# Patient Record
Sex: Male | Born: 1945 | Race: White | Hispanic: No | Marital: Married | Smoking: Never smoker
Health system: Southern US, Community
[De-identification: ages and names within clinical notes are randomized; demographics above are authoritative.]

## PROBLEM LIST (undated history)

## (undated) DIAGNOSIS — N138 Other obstructive and reflux uropathy: Secondary | ICD-10-CM

## (undated) DIAGNOSIS — R7303 Prediabetes: Secondary | ICD-10-CM

## (undated) DIAGNOSIS — I10 Essential (primary) hypertension: Secondary | ICD-10-CM

## (undated) DIAGNOSIS — N400 Enlarged prostate without lower urinary tract symptoms: Secondary | ICD-10-CM

## (undated) DIAGNOSIS — I4891 Unspecified atrial fibrillation: Secondary | ICD-10-CM

## (undated) DIAGNOSIS — G4730 Sleep apnea, unspecified: Secondary | ICD-10-CM

## (undated) DIAGNOSIS — K561 Intussusception: Secondary | ICD-10-CM

## (undated) DIAGNOSIS — R002 Palpitations: Secondary | ICD-10-CM

## (undated) DIAGNOSIS — T8859XA Other complications of anesthesia, initial encounter: Secondary | ICD-10-CM

## (undated) DIAGNOSIS — B259 Cytomegaloviral disease, unspecified: Secondary | ICD-10-CM

## (undated) DIAGNOSIS — T4145XA Adverse effect of unspecified anesthetic, initial encounter: Secondary | ICD-10-CM

## (undated) DIAGNOSIS — N401 Enlarged prostate with lower urinary tract symptoms: Secondary | ICD-10-CM

## (undated) DIAGNOSIS — D126 Benign neoplasm of colon, unspecified: Secondary | ICD-10-CM

## (undated) DIAGNOSIS — J189 Pneumonia, unspecified organism: Secondary | ICD-10-CM

## (undated) DIAGNOSIS — R7989 Other specified abnormal findings of blood chemistry: Secondary | ICD-10-CM

## (undated) DIAGNOSIS — M75122 Complete rotator cuff tear or rupture of left shoulder, not specified as traumatic: Secondary | ICD-10-CM

## (undated) DIAGNOSIS — M7582 Other shoulder lesions, left shoulder: Secondary | ICD-10-CM

## (undated) DIAGNOSIS — M1990 Unspecified osteoarthritis, unspecified site: Secondary | ICD-10-CM

## (undated) DIAGNOSIS — Z860100 Personal history of colon polyps, unspecified: Secondary | ICD-10-CM

## (undated) DIAGNOSIS — M222X1 Patellofemoral disorders, right knee: Secondary | ICD-10-CM

## (undated) DIAGNOSIS — M7522 Bicipital tendinitis, left shoulder: Secondary | ICD-10-CM

## (undated) DIAGNOSIS — M1711 Unilateral primary osteoarthritis, right knee: Secondary | ICD-10-CM

## (undated) DIAGNOSIS — Z7901 Long term (current) use of anticoagulants: Secondary | ICD-10-CM

## (undated) DIAGNOSIS — H53001 Unspecified amblyopia, right eye: Secondary | ICD-10-CM

## (undated) DIAGNOSIS — R42 Dizziness and giddiness: Secondary | ICD-10-CM

## (undated) HISTORY — PX: SMALL BOWEL REPAIR: SHX6447

## (undated) HISTORY — PX: WISDOM TOOTH EXTRACTION: SHX21

## (undated) HISTORY — PX: COLON SURGERY: SHX602

## (undated) HISTORY — PX: COLONOSCOPY: SHX174

---
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
## 2004-05-08 ENCOUNTER — Ambulatory Visit: Payer: Self-pay

## 2005-04-10 ENCOUNTER — Ambulatory Visit: Payer: Self-pay | Admitting: Unknown Physician Specialty

## 2006-06-29 ENCOUNTER — Ambulatory Visit: Payer: Self-pay | Admitting: Unknown Physician Specialty

## 2009-06-18 ENCOUNTER — Ambulatory Visit: Payer: Self-pay | Admitting: Unknown Physician Specialty

## 2010-08-28 ENCOUNTER — Ambulatory Visit: Payer: Self-pay | Admitting: Unknown Physician Specialty

## 2010-09-01 LAB — PATHOLOGY REPORT

## 2014-03-16 ENCOUNTER — Ambulatory Visit: Payer: Self-pay | Admitting: Otolaryngology

## 2015-09-04 ENCOUNTER — Encounter: Payer: Self-pay | Admitting: *Deleted

## 2015-09-05 ENCOUNTER — Encounter: Payer: Self-pay | Admitting: *Deleted

## 2015-09-05 ENCOUNTER — Ambulatory Visit: Payer: Medicare Other | Admitting: Certified Registered Nurse Anesthetist

## 2015-09-05 ENCOUNTER — Ambulatory Visit
Admission: RE | Admit: 2015-09-05 | Discharge: 2015-09-05 | Disposition: A | Discharge status: Home / Self Care | Payer: Medicare Other | Source: Ambulatory Visit | Attending: Unknown Physician Specialty | Admitting: Unknown Physician Specialty

## 2015-09-05 ENCOUNTER — Encounter
Admission: RE | Disposition: A | Discharge status: Home / Self Care | Payer: Self-pay | Source: Ambulatory Visit | Attending: Unknown Physician Specialty

## 2015-09-05 DIAGNOSIS — N400 Enlarged prostate without lower urinary tract symptoms: Secondary | ICD-10-CM | POA: Diagnosis not present

## 2015-09-05 DIAGNOSIS — D122 Benign neoplasm of ascending colon: Secondary | ICD-10-CM | POA: Insufficient documentation

## 2015-09-05 DIAGNOSIS — D123 Benign neoplasm of transverse colon: Secondary | ICD-10-CM | POA: Diagnosis not present

## 2015-09-05 DIAGNOSIS — D127 Benign neoplasm of rectosigmoid junction: Secondary | ICD-10-CM | POA: Insufficient documentation

## 2015-09-05 DIAGNOSIS — K5730 Diverticulosis of large intestine without perforation or abscess without bleeding: Secondary | ICD-10-CM | POA: Diagnosis not present

## 2015-09-05 DIAGNOSIS — Z79899 Other long term (current) drug therapy: Secondary | ICD-10-CM | POA: Insufficient documentation

## 2015-09-05 DIAGNOSIS — K640 First degree hemorrhoids: Secondary | ICD-10-CM | POA: Insufficient documentation

## 2015-09-05 DIAGNOSIS — Z1211 Encounter for screening for malignant neoplasm of colon: Secondary | ICD-10-CM | POA: Insufficient documentation

## 2015-09-05 HISTORY — DX: Intussusception: K56.1

## 2015-09-05 HISTORY — PX: COLONOSCOPY WITH PROPOFOL: SHX5780

## 2015-09-05 HISTORY — DX: Unspecified amblyopia, right eye: H53.001

## 2015-09-05 HISTORY — DX: Benign prostatic hyperplasia without lower urinary tract symptoms: N40.0

## 2015-09-05 SURGERY — COLONOSCOPY WITH PROPOFOL
Anesthesia: General

## 2015-09-05 MED ORDER — PROPOFOL 10 MG/ML IV BOLUS
INTRAVENOUS | Status: DC | PRN
  Administered 2015-09-05 (×3): 10 mg via INTRAVENOUS
  Administered 2015-09-05: 20 mg via INTRAVENOUS
  Administered 2015-09-05: 10 mg via INTRAVENOUS
  Administered 2015-09-05: 20 mg via INTRAVENOUS

## 2015-09-05 MED ORDER — SODIUM CHLORIDE 0.9 % IV SOLN: INTRAVENOUS | Status: DC

## 2015-09-05 MED ORDER — LIDOCAINE HCL (CARDIAC) 20 MG/ML IV SOLN
INTRAVENOUS | Status: DC | PRN
  Administered 2015-09-05: 60 mg via INTRAVENOUS

## 2015-09-05 MED ORDER — MIDAZOLAM HCL 2 MG/2ML IJ SOLN
INTRAMUSCULAR | Status: DC | PRN
  Administered 2015-09-05: 1 mg via INTRAVENOUS

## 2015-09-05 MED ORDER — PROPOFOL 500 MG/50ML IV EMUL
INTRAVENOUS | Status: DC | PRN
  Administered 2015-09-05: 140 ug/kg/min via INTRAVENOUS

## 2015-09-05 MED ORDER — SODIUM CHLORIDE 0.9 % IV SOLN
INTRAVENOUS | Status: DC
Start: 2015-09-05 — End: 2015-09-05
  Administered 2015-09-05: 07:00:00 via INTRAVENOUS

## 2015-09-05 NOTE — Transfer of Care (Signed)
Immediate Anesthesia Transfer of Care Note  Patient: Raymond Vazquez.  Procedure(s) Performed: Procedure(s): COLONOSCOPY WITH PROPOFOL (N/A)  Patient Location: PACU  Anesthesia Type:General  Level of Consciousness: sedated  Airway & Oxygen Therapy: Patient Spontanous Breathing and Patient connected to nasal cannula oxygen  Post-op Assessment: Report given to RN and Post -op Vital signs reviewed and stable  Post vital signs: Reviewed and stable  Last Vitals:  Filed Vitals:   09/05/15 0703 09/05/15 0813  BP: 148/81 93/61  Pulse: 79 64  Temp: 36.3 C 36.2 C  Resp: 16 28    Complications: No apparent anesthesia complications

## 2015-09-05 NOTE — H&P (Signed)
   Primary Care Physician:  No primary care provider on file. Primary Gastroenterologist:  Dr. Vira Agar  Pre-Procedure History & Physical: HPI:  Raymond Vazquez. is a 70 y.o. male is here for an colonoscopy.   Past Medical History  Diagnosis Date  . Hypertrophy (benign) of prostate   . 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 Amblyopia, right eye     Past Surgical History  Procedure Laterality Date  . Small bowel repair    . Wisdom tooth extraction      Prior to Admission medications   Medication Sig Start Date End Date Taking? Authorizing Provider  dutasteride (AVODART) 0.5 MG capsule Take 0.5 mg by mouth daily.   Yes Historical Provider, MD  Multiple Vitamin (MULTIVITAMIN) tablet Take 1 tablet by mouth daily.   Yes Historical Provider, MD  tamsulosin (FLOMAX) 0.4 MG CAPS capsule Take 0.4 mg by mouth daily.   Yes Historical Provider, MD    Allergies as of 08/21/2015  . (Not on File)    History reviewed. No pertinent family history.  Social History   Social History  . Marital Status: Married    Spouse Name: N/A  . Number of Children: N/A  . Years of Education: N/A   Occupational History  . Not on file.   Social History Main Topics  . Smoking status: Never Smoker   . Smokeless tobacco: Never Used  . Alcohol Use: No  . Drug Use: No  . Sexual Activity: Not on file   Other Topics Concern  . Not on file   Social History Narrative    Review of Systems: See HPI, otherwise negative ROS  Physical Exam: BP 148/81 mmHg  Pulse 79  Temp(Src) 97.4 F (36.3 C) (Tympanic)  Resp 16  Ht 5\' 9"  (1.753 m)  Wt 76.204 kg (168 lb)  BMI 24.80 kg/m2  SpO2 97% General:   Alert,  pleasant and cooperative in NAD Head:  Normocephalic and atraumatic. Neck:  Supple; no masses or thyromegaly. Lungs:  Clear throughout to auscultation.    Heart:  Regular rate and rhythm. Abdomen:  Soft, nontender and nondistended. Normal bowel sounds, without guarding, and without rebound.    Neurologic:  Alert and  oriented x4;  grossly normal neurologically.  Impression/Plan: Raymond Vazquez. is here for an colonoscopy to be performed for [facility] colon polyps  Risks, benefits, limitations, and alternatives regarding  colonoscopy have been reviewed with the patient.  Questions have been answered.  All parties agreeable.   Gaylyn Cheers, MD  09/05/2015, 7:22 AM

## 2015-09-05 NOTE — Anesthesia Procedure Notes (Signed)
Date/Time: 09/05/2015 7:28 AM Performed by: Johnna Acosta Pre-anesthesia Checklist: Patient identified, Emergency Drugs available, Suction available, Patient being monitored and Timeout performed Patient Re-evaluated:Patient Re-evaluated prior to inductionOxygen Delivery Method: Nasal cannula

## 2015-09-05 NOTE — Anesthesia Postprocedure Evaluation (Signed)
Anesthesia Post Note  Patient: Raymond Vazquez.  Procedure(s) Performed: Procedure(s) (LRB): COLONOSCOPY WITH PROPOFOL (N/A)  Patient location during evaluation: PACU Anesthesia Type: General Level of consciousness: awake and alert Pain management: pain level controlled Vital Signs Assessment: post-procedure vital signs reviewed and stable Respiratory status: spontaneous breathing, nonlabored ventilation, respiratory function stable and patient connected to nasal cannula oxygen Cardiovascular status: blood pressure returned to baseline and stable Postop Assessment: no signs of nausea or vomiting Anesthetic complications: no    Last Vitals:  Filed Vitals:   09/05/15 0843 09/05/15 0853  BP: 128/77 136/73  Pulse: 53 56  Temp:    Resp: 14 13    Last Pain:  Filed Vitals:   09/05/15 0855  PainSc: Asleep                 Molli Barrows

## 2015-09-05 NOTE — Anesthesia Preprocedure Evaluation (Signed)
Anesthesia Evaluation  Patient identified by MRN, date of birth, ID band Patient awake    Reviewed: Allergy & Precautions, H&P , NPO status , Patient's Chart, lab work & pertinent test results, reviewed documented beta blocker date and time   Airway Mallampati: II   Neck ROM: full    Dental  (+) Poor Dentition   Pulmonary neg pulmonary ROS,    Pulmonary exam normal        Cardiovascular negative cardio ROS Normal cardiovascular exam     Neuro/Psych negative neurological ROS  negative psych ROS   GI/Hepatic negative GI ROS, Neg liver ROS,   Endo/Other  negative endocrine ROS  Renal/GU negative Renal ROS  negative genitourinary   Musculoskeletal   Abdominal   Peds  Hematology negative hematology ROS (+)   Anesthesia Other Findings Past Medical History:   Hypertrophy (benign) of prostate                             Intussusception ([facility])                                        Amblyopia, right eye                                       Past Surgical History:   SMALL BOWEL REPAIR                                            WISDOM TOOTH EXTRACTION                                     BMI    Body Mass Index   24.79 kg/m 2     Reproductive/Obstetrics                             Anesthesia Physical Anesthesia Plan  ASA: II  Anesthesia Plan: General   Post-op Pain Management:    Induction:   Airway Management Planned:   Additional Equipment:   Intra-op Plan:   Post-operative Plan:   Informed Consent: I have reviewed the patients History and Physical, chart, labs and discussed the procedure including the risks, benefits and alternatives for the proposed anesthesia with the patient or authorized representative who has indicated his/her understanding and acceptance.   Dental Advisory Given  Plan Discussed with: CRNA  Anesthesia Plan Comments:         Anesthesia Quick  Evaluation

## 2015-09-05 NOTE — Op Note (Signed)
[facility] Gastroenterology Patient Name: Raymond Vazquez Procedure Date: 09/05/2015 6:34 AM MRN: [id number] Account #: [id number] Date of Birth: 10-05-1945 Admit Type: Outpatient Age: 70 Room: [facility] ENDO ROOM 1 Gender: Male Note Status: Finalized Procedure:            Colonoscopy Indications:          High risk colon cancer surveillance: Personal history                        of colonic polyps Providers:            Manya Silvas, MD Referring MD:         Ocie Cornfield. Ouida Sills, MD (Referring MD) Medicines:            Propofol per Anesthesia Complications:        No immediate complications. Procedure:            Pre-Anesthesia Assessment:                       - After reviewing the risks and benefits, the patient                        was deemed in satisfactory condition to undergo the                        procedure.                       After obtaining informed consent, the colonoscope was                        passed under direct vision. Throughout the procedure,                        the patient's blood pressure, pulse, and oxygen                        saturations were monitored continuously. The                        Colonoscope was introduced through the anus and                        advanced to the the cecum, identified by appendiceal                        orifice and ileocecal valve. The colonoscopy was                        performed without difficulty. The patient tolerated the                        procedure well. The quality of the bowel preparation                        was excellent. Findings:      Two sessile polyps were found in the ascending colon. The polyps were       diminutive in size. These polyps were removed with a jumbo cold forceps.       Resection and retrieval were complete.      Two sessile  polyps were found in the transverse colon. The polyps were       diminutive in size. These polyps were removed with a jumbo cold  forceps.       Resection and retrieval were complete.      A small polyp was found in the transverse colon. The polyp was sessile.       The polyp was removed with a hot snare. Resection and retrieval were       complete. To prevent bleeding after the polypectomy, one hemostatic clip       was successfully placed. There was no bleeding during, or at the end, of       the procedure.      A diminutive polyp was found in the recto-sigmoid colon. The polyp was       sessile. The polyp was removed with a jumbo cold forceps. Resection and       retrieval were complete.      A few small-mouthed diverticula were found in the transverse colon and       ascending colon.      Internal hemorrhoids were found during endoscopy. The hemorrhoids were       small and Grade I (internal hemorrhoids that do not prolapse).      The exam was otherwise without abnormality. Impression:           - Two diminutive polyps in the ascending colon, removed                        with a jumbo cold forceps. Resected and retrieved.                       - Two diminutive polyps in the transverse colon,                        removed with a jumbo cold forceps. Resected and                        retrieved.                       - One small polyp in the transverse colon, removed with                        a hot snare. Resected and retrieved. Clip was placed.                       - One diminutive polyp at the recto-sigmoid colon,                        removed with a jumbo cold forceps. Resected and                        retrieved.                       - Diverticulosis in the transverse colon and in the                        ascending colon.                       - Internal hemorrhoids.                       -  The examination was otherwise normal. Recommendation:       - Await pathology results. Manya Silvas, MD 09/05/2015 8:12:36 AM This report has been signed electronically. Number of Addenda: 0 Note  Initiated On: 09/05/2015 6:34 AM Scope Withdrawal Time: 0 hours 25 minutes 55 seconds  Total Procedure Duration: 0 hours 37 minutes 37 seconds       [facility]

## 2015-09-08 ENCOUNTER — Encounter: Payer: Self-pay | Admitting: Unknown Physician Specialty

## 2015-09-08 LAB — SURGICAL PATHOLOGY

## 2019-01-23 ENCOUNTER — Other Ambulatory Visit: Payer: Self-pay

## 2019-01-23 ENCOUNTER — Other Ambulatory Visit
Admission: RE | Admit: 2019-01-23 | Discharge: 2019-01-23 | Disposition: A | Discharge status: Home / Self Care | Payer: Medicare Other | Source: Ambulatory Visit | Attending: Gastroenterology | Admitting: Gastroenterology

## 2019-01-23 DIAGNOSIS — K5790 Diverticulosis of intestine, part unspecified, without perforation or abscess without bleeding: Secondary | ICD-10-CM | POA: Insufficient documentation

## 2019-01-23 DIAGNOSIS — K649 Unspecified hemorrhoids: Secondary | ICD-10-CM | POA: Diagnosis not present

## 2019-01-23 DIAGNOSIS — Z86010 Personal history of colonic polyps: Secondary | ICD-10-CM | POA: Diagnosis not present

## 2019-01-23 DIAGNOSIS — Z20828 Contact with and (suspected) exposure to other viral communicable diseases: Secondary | ICD-10-CM | POA: Diagnosis not present

## 2019-01-23 DIAGNOSIS — Z01812 Encounter for preprocedural laboratory examination: Secondary | ICD-10-CM | POA: Insufficient documentation

## 2019-01-24 LAB — SARS CORONAVIRUS 2 (TAT 6-24 HRS): SARS Coronavirus 2: NEGATIVE

## 2019-01-25 ENCOUNTER — Encounter: Payer: Self-pay | Admitting: *Deleted

## 2019-01-26 ENCOUNTER — Ambulatory Visit: Payer: Medicare Other | Admitting: Certified Registered Nurse Anesthetist

## 2019-01-26 ENCOUNTER — Encounter: Payer: Self-pay | Admitting: *Deleted

## 2019-01-26 ENCOUNTER — Encounter
Admission: RE | Disposition: A | Discharge status: Home / Self Care | Payer: Self-pay | Source: Home / Self Care | Attending: Gastroenterology

## 2019-01-26 ENCOUNTER — Ambulatory Visit
Admission: RE | Admit: 2019-01-26 | Discharge: 2019-01-26 | Disposition: A | Discharge status: Home / Self Care | Payer: Medicare Other | Attending: Gastroenterology | Admitting: Gastroenterology

## 2019-01-26 DIAGNOSIS — K5730 Diverticulosis of large intestine without perforation or abscess without bleeding: Secondary | ICD-10-CM | POA: Insufficient documentation

## 2019-01-26 DIAGNOSIS — Z79899 Other long term (current) drug therapy: Secondary | ICD-10-CM | POA: Diagnosis not present

## 2019-01-26 DIAGNOSIS — Z86010 Personal history of colonic polyps: Secondary | ICD-10-CM | POA: Insufficient documentation

## 2019-01-26 DIAGNOSIS — D123 Benign neoplasm of transverse colon: Secondary | ICD-10-CM | POA: Insufficient documentation

## 2019-01-26 DIAGNOSIS — N400 Enlarged prostate without lower urinary tract symptoms: Secondary | ICD-10-CM | POA: Insufficient documentation

## 2019-01-26 DIAGNOSIS — K644 Residual hemorrhoidal skin tags: Secondary | ICD-10-CM | POA: Diagnosis not present

## 2019-01-26 DIAGNOSIS — Z1211 Encounter for screening for malignant neoplasm of colon: Secondary | ICD-10-CM | POA: Diagnosis not present

## 2019-01-26 DIAGNOSIS — K641 Second degree hemorrhoids: Secondary | ICD-10-CM | POA: Diagnosis not present

## 2019-01-26 HISTORY — PX: COLONOSCOPY WITH PROPOFOL: SHX5780

## 2019-01-26 SURGERY — COLONOSCOPY WITH PROPOFOL
Anesthesia: General

## 2019-01-26 MED ORDER — GLYCOPYRROLATE 0.2 MG/ML IJ SOLN
INTRAMUSCULAR | Status: DC | PRN
  Administered 2019-01-26: 0.2 mg via INTRAVENOUS

## 2019-01-26 MED ORDER — GLYCOPYRROLATE 0.2 MG/ML IJ SOLN
INTRAMUSCULAR | Status: AC
  Filled 2019-01-26: qty 1

## 2019-01-26 MED ORDER — PROPOFOL 10 MG/ML IV BOLUS
INTRAVENOUS | Status: AC
  Filled 2019-01-26: qty 20

## 2019-01-26 MED ORDER — SODIUM CHLORIDE 0.9 % IV SOLN: INTRAVENOUS | Status: DC

## 2019-01-26 MED ORDER — LIDOCAINE HCL (PF) 2 % IJ SOLN
INTRAMUSCULAR | Status: AC
  Filled 2019-01-26: qty 10

## 2019-01-26 MED ORDER — SODIUM CHLORIDE 0.9 % IV SOLN
INTRAVENOUS | Status: DC
  Administered 2019-01-26: 1000 mL via INTRAVENOUS

## 2019-01-26 MED ORDER — EPHEDRINE SULFATE 50 MG/ML IJ SOLN
INTRAMUSCULAR | Status: AC
  Filled 2019-01-26: qty 1

## 2019-01-26 MED ORDER — PROPOFOL 500 MG/50ML IV EMUL
INTRAVENOUS | Status: DC | PRN
  Administered 2019-01-26: 150 ug/kg/min via INTRAVENOUS

## 2019-01-26 MED ORDER — LIDOCAINE HCL (CARDIAC) PF 100 MG/5ML IV SOSY
PREFILLED_SYRINGE | INTRAVENOUS | Status: DC | PRN
  Administered 2019-01-26: 80 mg via INTRATRACHEAL

## 2019-01-26 MED ORDER — PROPOFOL 10 MG/ML IV BOLUS
INTRAVENOUS | Status: DC | PRN
  Administered 2019-01-26: 60 mg via INTRAVENOUS

## 2019-01-26 MED ORDER — EPHEDRINE SULFATE 50 MG/ML IJ SOLN
INTRAMUSCULAR | Status: DC | PRN
  Administered 2019-01-26: 10 mg via INTRAVENOUS

## 2019-01-26 NOTE — Transfer of Care (Signed)
Immediate Anesthesia Transfer of Care Note  Patient: Raymond Vazquez.  Procedure(s) Performed: COLONOSCOPY WITH PROPOFOL (N/A )  Patient Location: PACU  Anesthesia Type:General  Level of Consciousness: drowsy  Airway & Oxygen Therapy: Patient Spontanous Breathing and Patient connected to nasal cannula oxygen  Post-op Assessment: Report given to RN, Post -op Vital signs reviewed and stable and Patient moving all extremities X 4  Post vital signs: Reviewed and stable  Last Vitals:  Vitals Value Taken Time  BP 122/78 01/26/19 1000  Temp 36.4 C 01/26/19 1000  Pulse 80 01/26/19 1000  Resp 14 01/26/19 1000  SpO2 100 % 01/26/19 1000    Last Pain:  Vitals:   01/26/19 1000  TempSrc: Tympanic  PainSc:          Complications: No apparent anesthesia complications

## 2019-01-26 NOTE — Anesthesia Preprocedure Evaluation (Signed)
Anesthesia Evaluation  Patient identified by MRN, date of birth, ID band Patient awake    Reviewed: Allergy & Precautions, H&P , NPO status , Patient's Chart, lab work & pertinent test results  Airway Mallampati: I  TM Distance: >3 FB Neck ROM: full    Dental  (+) Teeth Intact   Pulmonary neg pulmonary ROS, neg COPD,           Cardiovascular (-) angina(-) Past MI and (-) Cardiac Stents negative cardio ROS  (-) dysrhythmias      Neuro/Psych negative neurological ROS  negative psych ROS   GI/Hepatic Neg liver ROS, H/o intussusception and small bowel repair   Endo/Other  negative endocrine ROS  Renal/GU negative Renal ROS  negative genitourinary   Musculoskeletal   Abdominal   Peds  Hematology negative hematology ROS (+)   Anesthesia Other Findings Past Medical History: No date: Amblyopia, right eye No date: Hypertrophy (benign) of prostate No date: Intussusception [facility])  Past Surgical History: No date: COLON SURGERY 09/05/2015: COLONOSCOPY WITH PROPOFOL; N/A     Comment:  Procedure: COLONOSCOPY WITH PROPOFOL;  Surgeon: Manya Silvas, MD;  Location[facility] ENDOSCOPY;  Service:               Endoscopy;  Laterality: N/A; No date: SMALL BOWEL REPAIR No date: WISDOM TOOTH EXTRACTION  BMI    Body Mass Index: 23.63 kg/m      Reproductive/Obstetrics negative OB ROS                             Anesthesia Physical Anesthesia Plan  ASA: I  Anesthesia Plan: General   Post-op Pain Management:    Induction:   PONV Risk Score and Plan: Propofol infusion and TIVA  Airway Management Planned: Natural Airway and Nasal Cannula  Additional Equipment:   Intra-op Plan:   Post-operative Plan:   Informed Consent: I have reviewed the patients History and Physical, chart, labs and discussed the procedure including the risks, benefits and alternatives for the proposed  anesthesia with the patient or authorized representative who has indicated his/her understanding and acceptance.     Dental Advisory Given  Plan Discussed with: Anesthesiologist and CRNA  Anesthesia Plan Comments:         Anesthesia Quick Evaluation

## 2019-01-26 NOTE — Op Note (Signed)
[facility] Gastroenterology Patient Name: Raymond Vazquez Procedure Date: 01/26/2019 8:31 AM MRN: [id number] Account #: [id number] Date of Birth: 1945/08/18 Admit Type: Outpatient Age: 73 Room: [facility] ENDO ROOM 2 Gender: Male Note Status: Finalized Procedure:            Colonoscopy Indications:          Personal history of colonic polyps Providers:            Lollie Sails, MD Referring MD:         Ocie Cornfield. Ouida Sills MD, MD (Referring MD) Medicines:            Monitored Anesthesia Care Complications:        No immediate complications. Procedure:            Pre-Anesthesia Assessment:                       - ASA Grade Assessment: I - A normal, healthy patient.                       After obtaining informed consent, the colonoscope was                        passed under direct vision. Throughout the procedure,                        the patient's blood pressure, pulse, and oxygen                        saturations were monitored continuously. The                        Colonoscope was introduced through the anus and                        advanced to the the cecum, identified by appendiceal                        orifice and ileocecal valve. The colonoscopy was                        unusually difficult due to a redundant colon and                        significant looping. Successful completion of the                        procedure was aided by changing the patient to a supine                        position, changing the patient to a prone position,                        using manual pressure and withdrawing and reinserting                        the scope. The patient tolerated the procedure well.                        The quality of the bowel preparation was good. Findings:  Multiple medium-mouthed diverticula were found in the sigmoid colon,       descending colon, transverse colon and ascending colon.      A 2 mm polyp was found in the  transverse colon. The polyp was sessile.       The polyp was removed with a cold biopsy forceps. Resection and       retrieval were complete.      A 3 mm polyp was found in the transverse colon. The polyp was sessile.       The polyp was removed with a cold biopsy forceps. Resection and       retrieval were complete.      Non-bleeding external and internal hemorrhoids were found during       retroflexion, during digital exam and during anoscopy. The hemorrhoids       were small and Grade II (internal hemorrhoids that prolapse but reduce       spontaneously).      No additional abnormalities were found on retroflexion.      Possible tattoo site located in the proximal transverse region, no       evidence of residual polyp. Impression:           - Diverticulosis in the sigmoid colon, in the                        descending colon, in the transverse colon and in the                        ascending colon.                       - One 2 mm polyp in the transverse colon, removed with                        a cold biopsy forceps. Resected and retrieved.                       - One 3 mm polyp in the transverse colon, removed with                        a cold biopsy forceps. Resected and retrieved.                       - Non-bleeding external and internal hemorrhoids. Recommendation:       - Discharge patient to home.                       - Await pathology results.                       - Telephone GI clinic for pathology results in 5 days. Procedure Code(s):    --- Professional ---                       [phone], Colonoscopy, flexible; with biopsy, single or                        multiple Diagnosis Code(s):    --- Professional ---                       K64.1, Second degree hemorrhoids  K63.5, Polyp of colon                       Z86.010, Personal history of colonic polyps                       K57.30, Diverticulosis of large intestine without                         perforation or abscess without bleeding CPT copyright 2019 [employer]. All rights reserved. The codes documented in this report are preliminary and upon coder review may  be revised to meet current compliance requirements. Lollie Sails, MD 01/26/2019 10:00:53 AM This report has been signed electronically. Number of Addenda: 0 Note Initiated On: 01/26/2019 8:31 AM Scope Withdrawal Time: 0 hours 13 minutes 55 seconds  Total Procedure Duration: 0 hours 51 minutes 7 seconds       [facility]

## 2019-01-26 NOTE — Anesthesia Postprocedure Evaluation (Signed)
Anesthesia Post Note  Patient: Raymond Vazquez.  Procedure(s) Performed: COLONOSCOPY WITH PROPOFOL (N/A )  Patient location during evaluation: PACU Anesthesia Type: General Level of consciousness: awake and alert Pain management: pain level controlled Vital Signs Assessment: post-procedure vital signs reviewed and stable Respiratory status: spontaneous breathing, nonlabored ventilation and respiratory function stable Cardiovascular status: blood pressure returned to baseline and stable Postop Assessment: no apparent nausea or vomiting Anesthetic complications: no     Last Vitals:  Vitals:   01/26/19 1010 01/26/19 1020  BP: 126/75 127/78  Pulse: 69 65  Resp: 15 15  Temp:    SpO2: 98% 100%    Last Pain:  Vitals:   01/26/19 1000  TempSrc: Tympanic  PainSc:                  Durenda Hurt

## 2019-01-26 NOTE — H&P (Signed)
Outpatient short stay form Pre-procedure 01/26/2019 8:46 AM Lollie Sails MD  Primary Physician: Dr. Frazier Richards  Reason for visit: Colonoscopy  History of present illness: Patient is a 73 year old male presenting today for colonoscopy in regards to his personal history of adenomatous colon polyps.  His last colonoscopy was 09/05/2015 with a finding of 6 adenomas removed at that time.  Tolerated his prep well.  He takes no aspirin or blood thinning agent.    Current Facility-Administered Medications:  .  0.9 %  sodium chloride infusion, , Intravenous, Continuous, Lollie Sails, MD .  0.9 %  sodium chloride infusion, , Intravenous, Continuous, Lollie Sails, MD, Last Rate: 20 mL/hr at 01/26/19 0842, 1,000 mL at 01/26/19 [id number]  Medications Prior to Admission  Medication Sig Dispense Refill Last Dose  . dutasteride (AVODART) 0.5 MG capsule Take 0.5 mg by mouth daily.   Past Week at Unknown time  . Multiple Vitamin (MULTIVITAMIN) tablet Take 1 tablet by mouth daily.   Past Week at Unknown time  . tamsulosin (FLOMAX) 0.4 MG CAPS capsule Take 0.4 mg by mouth daily.   Past Week at Unknown time     No Known Allergies   Past Medical History:  Diagnosis Date  . Amblyopia, right eye   . Hypertrophy (benign) of prostate   . 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    HEENT: Normocephalic atraumatic eyes are anicteric    Other:    Pertinant exam for procedure: Soft nontender nondistended bowel sounds positive normoactive    Planned proceedures: Colonoscopy and indicated procedures. I have discussed the risks benefits and complications of procedures to include not limited to bleeding, infection, perforation and the risk of sedation and the patient wishes to proceed.    Lollie Sails, MD Gastroenterology 01/26/2019  8:46 AM

## 2019-01-26 NOTE — Anesthesia Post-op Follow-up Note (Signed)
Anesthesia QCDR form completed.        

## 2019-01-29 ENCOUNTER — Encounter: Payer: Self-pay | Admitting: Gastroenterology

## 2019-01-29 LAB — SURGICAL PATHOLOGY

## 2019-03-29 ENCOUNTER — Emergency Department: Payer: Medicare Other

## 2019-03-29 ENCOUNTER — Observation Stay
Admission: EM | Admit: 2019-03-29 | Discharge: 2019-03-30 | Disposition: A | Discharge status: Home / Self Care | Payer: Medicare Other | Attending: Internal Medicine | Admitting: Internal Medicine

## 2019-03-29 ENCOUNTER — Encounter: Payer: Self-pay | Admitting: Emergency Medicine

## 2019-03-29 ENCOUNTER — Other Ambulatory Visit: Payer: Self-pay

## 2019-03-29 DIAGNOSIS — Z79899 Other long term (current) drug therapy: Secondary | ICD-10-CM | POA: Insufficient documentation

## 2019-03-29 DIAGNOSIS — Z7901 Long term (current) use of anticoagulants: Secondary | ICD-10-CM | POA: Diagnosis not present

## 2019-03-29 DIAGNOSIS — R739 Hyperglycemia, unspecified: Secondary | ICD-10-CM | POA: Insufficient documentation

## 2019-03-29 DIAGNOSIS — N400 Enlarged prostate without lower urinary tract symptoms: Secondary | ICD-10-CM | POA: Insufficient documentation

## 2019-03-29 DIAGNOSIS — I700 Atherosclerosis of aorta: Secondary | ICD-10-CM | POA: Insufficient documentation

## 2019-03-29 DIAGNOSIS — I4891 Unspecified atrial fibrillation: Secondary | ICD-10-CM | POA: Diagnosis not present

## 2019-03-29 DIAGNOSIS — Z20828 Contact with and (suspected) exposure to other viral communicable diseases: Secondary | ICD-10-CM | POA: Insufficient documentation

## 2019-03-29 LAB — CBC
HCT: 49 % (ref 39.0–52.0)
Hemoglobin: 16 g/dL (ref 13.0–17.0)
MCH: 28.4 pg (ref 26.0–34.0)
MCHC: 32.7 g/dL (ref 30.0–36.0)
MCV: 86.9 fL (ref 80.0–100.0)
Platelets: 215 10*3/uL (ref 150–400)
RBC: 5.64 MIL/uL (ref 4.22–5.81)
RDW: 12.3 % (ref 11.5–15.5)
WBC: 6.3 10*3/uL (ref 4.0–10.5)
nRBC: 0 % (ref 0.0–0.2)

## 2019-03-29 LAB — BASIC METABOLIC PANEL
Anion gap: 12 (ref 5–15)
BUN: 28 mg/dL — ABNORMAL HIGH (ref 8–23)
CO2: 24 mmol/L (ref 22–32)
Calcium: 9.3 mg/dL (ref 8.9–10.3)
Chloride: 105 mmol/L (ref 98–111)
Creatinine, Ser: 0.93 mg/dL (ref 0.61–1.24)
GFR calc Af Amer: >60 mL/min (ref >60)
GFR calc non Af Amer: >60 mL/min (ref >60)
Glucose, Bld: 181 mg/dL — ABNORMAL HIGH (ref 70–99)
Potassium: 3.9 mmol/L (ref 3.5–5.1)
Sodium: 141 mmol/L (ref 135–145)

## 2019-03-29 LAB — HEMOGLOBIN A1C
Hgb A1c MFr Bld: 5.6 % (ref 4.8–5.6)
Mean Plasma Glucose: 114.02 mg/dL

## 2019-03-29 LAB — TSH: TSH: 1.905 u[IU]/mL (ref 0.350–4.500)

## 2019-03-29 LAB — PROTIME-INR
INR: 0.9 (ref 0.8–1.2)
Prothrombin Time: 12.3 seconds (ref 11.4–15.2)

## 2019-03-29 LAB — SARS CORONAVIRUS 2 (TAT 6-24 HRS): SARS Coronavirus 2: NEGATIVE

## 2019-03-29 LAB — APTT: aPTT: 28 seconds (ref 24–36)

## 2019-03-29 LAB — TROPONIN I (HIGH SENSITIVITY): Troponin I (High Sensitivity): 4 ng/L (ref <18)

## 2019-03-29 LAB — T4, FREE: Free T4: 0.91 ng/dL (ref 0.61–1.12)

## 2019-03-29 MED ORDER — DILTIAZEM HCL 25 MG/5ML IV SOLN
20.0000 mg | Freq: Once | INTRAVENOUS | Status: AC
  Administered 2019-03-29: 11:00:00 20 mg via INTRAVENOUS
  Filled 2019-03-29: qty 5

## 2019-03-29 MED ORDER — DILTIAZEM HCL 100 MG IV SOLR
5.0000 mg/h | INTRAVENOUS | Status: DC
  Administered 2019-03-29: 5 mg/h via INTRAVENOUS
  Filled 2019-03-29: qty 100

## 2019-03-29 MED ORDER — TAMSULOSIN HCL 0.4 MG PO CAPS
0.4000 mg | ORAL_CAPSULE | Freq: Every day | ORAL | Status: DC
  Administered 2019-03-29: 0.4 mg via ORAL
  Filled 2019-03-29 (×2): qty 1

## 2019-03-29 MED ORDER — SODIUM CHLORIDE 0.9% FLUSH
3.0000 mL | Freq: Two times a day (BID) | INTRAVENOUS | Status: DC
  Administered 2019-03-29: 23:00:00 3 mL via INTRAVENOUS

## 2019-03-29 MED ORDER — APIXABAN 5 MG PO TABS
5.0000 mg | ORAL_TABLET | Freq: Two times a day (BID) | ORAL | Status: DC
  Administered 2019-03-29 – 2019-03-30 (×3): 5 mg via ORAL
  Filled 2019-03-29 (×3): qty 1

## 2019-03-29 MED ORDER — SODIUM CHLORIDE 0.9 % IV SOLN
Freq: Once | INTRAVENOUS | Status: AC
  Administered 2019-03-29: 11:00:00 via INTRAVENOUS

## 2019-03-29 MED ORDER — ACETAMINOPHEN 325 MG PO TABS: 650.0000 mg | ORAL_TABLET | Freq: Four times a day (QID) | ORAL | Status: DC | PRN

## 2019-03-29 MED ORDER — ACETAMINOPHEN 650 MG RE SUPP: 650.0000 mg | Freq: Four times a day (QID) | RECTAL | Status: DC | PRN

## 2019-03-29 MED ORDER — ONDANSETRON HCL 4 MG PO TABS: 4.0000 mg | ORAL_TABLET | Freq: Four times a day (QID) | ORAL | Status: DC | PRN

## 2019-03-29 MED ORDER — ONDANSETRON HCL 4 MG/2ML IJ SOLN: 4.0000 mg | Freq: Four times a day (QID) | INTRAMUSCULAR | Status: DC | PRN

## 2019-03-29 MED ORDER — DUTASTERIDE 0.5 MG PO CAPS
0.5000 mg | ORAL_CAPSULE | Freq: Every day | ORAL | Status: DC
  Administered 2019-03-29: 0.5 mg via ORAL
  Filled 2019-03-29 (×2): qty 1

## 2019-03-29 MED ORDER — DILTIAZEM HCL 25 MG/5ML IV SOLN
10.0000 mg | Freq: Once | INTRAVENOUS | Status: AC
  Administered 2019-03-29: 10 mg via INTRAVENOUS
  Filled 2019-03-29: qty 5

## 2019-03-29 MED ORDER — DILTIAZEM HCL ER COATED BEADS 120 MG PO CP24
240.0000 mg | ORAL_CAPSULE | Freq: Every day | ORAL | Status: DC
  Administered 2019-03-29 – 2019-03-30 (×2): 240 mg via ORAL
  Filled 2019-03-29: qty 1
  Filled 2019-03-29: qty 2
  Filled 2019-03-29: qty 1

## 2019-03-29 MED ORDER — POLYETHYLENE GLYCOL 3350 17 G PO PACK: 17.0000 g | PACK | Freq: Every day | ORAL | Status: DC | PRN

## 2019-03-29 NOTE — ED Notes (Signed)
Pt placed in hospital bed. Ate all of dinner tray. Given remote. No other needs.

## 2019-03-29 NOTE — ED Notes (Signed)
Pt given orange juice.

## 2019-03-29 NOTE — ED Provider Notes (Signed)
[facility] Emergency Department Provider Note       Time seen: ----------------------------------------- 10:16 AM on 03/29/2019 -----------------------------------------   I have reviewed the triage vital signs and the nursing notes.  HISTORY   Chief Complaint No chief complaint on file.    HPI Raymond Vazquez. is a 73 y.o. male with a history of BPH, intussusception who presents to the ED for irregular heartbeat.  Patient states his heart was fluttering this morning, he has never really had that before.  He reports a history of bigeminy once but no known history of arrhythmias.  He denies any recent illness or other complaints.  Past Medical History:  Diagnosis Date  . Amblyopia, right eye   . Hypertrophy (benign) of prostate   . Intussusception ([place])     There are no active problems to display for this patient.   Past Surgical History:  Procedure Laterality Date  . COLON SURGERY    . COLONOSCOPY WITH PROPOFOL N/A 09/05/2015   Procedure: COLONOSCOPY WITH PROPOFOL;  Surgeon: Manya Silvas, MD;  Location: [facility] ENDOSCOPY;  Service: Endoscopy;  Laterality: N/A;  . COLONOSCOPY WITH PROPOFOL N/A 01/26/2019   Procedure: COLONOSCOPY WITH PROPOFOL;  Surgeon: Lollie Sails, MD;  Location[facility] ENDOSCOPY;  Service: Endoscopy;  Laterality: N/A;  . SMALL BOWEL REPAIR    . WISDOM TOOTH EXTRACTION      Allergies Patient has no known allergies.  Social History Social History   Tobacco Use  . Smoking status: Never Smoker  . Smokeless tobacco: Never Used  Substance Use Topics  . Alcohol use: No  . Drug use: Never    Review of Systems Constitutional: Negative for fever. Cardiovascular: Negative for chest pain.  Positive for palpitations Respiratory: Negative for shortness of breath. Gastrointestinal: Negative for abdominal pain, vomiting and diarrhea. Musculoskeletal: Negative for back pain. Skin: Negative for rash. Neurological:  Negative for headaches, focal weakness or numbness.  All systems negative/normal/unremarkable except as stated in the HPI  ____________________________________________   PHYSICAL EXAM:  VITAL SIGNS: ED Triage Vitals  Enc Vitals Group     BP      Pulse      Resp      Temp      Temp src      SpO2      Weight      Height      Head Circumference      Peak Flow      Pain Score      Pain Loc      Pain Edu?      Excl. in [place]?     Constitutional: Alert and oriented. Well appearing and in no distress. Eyes: Conjunctivae are normal. Normal extraocular movements. ENT      Head: Normocephalic and atraumatic.      Nose: No congestion/rhinnorhea.      Mouth/Throat: Mucous membranes are moist.      Neck: No stridor. Cardiovascular: Rapid rate, irregular rhythm. No murmurs, rubs, or gallops. Respiratory: Normal respiratory effort without tachypnea nor retractions. Breath sounds are clear and equal bilaterally. No wheezes/rales/rhonchi. Gastrointestinal: Soft and nontender. Normal bowel sounds Musculoskeletal: Nontender with normal range of motion in extremities. No lower extremity tenderness nor edema. Neurologic:  Normal speech and language. No gross focal neurologic deficits are appreciated.  Skin:  Skin is warm, dry and intact. No rash noted. Psychiatric: Mood and affect are normal. Speech and behavior are normal.  ____________________________________________  EKG: Interpreted by me.  Atrial  fibrillation with a rapid ventricular response, rate is 161 bpm, PVCs, normal axis, normal QT  ____________________________________________  ED COURSE:  As part of my medical decision making, I reviewed the following data within the [place] History obtained from family if available, nursing notes, old chart and ekg, as well as notes from prior ED visits. Patient presented for palpitations, we will assess with labs and imaging as indicated at this time.    Procedures  Raymond Vazquez. was evaluated in Emergency Department on 03/29/2019 for the symptoms described in the history of present illness. He was evaluated in the context of the global COVID-19 pandemic, which necessitated consideration that the patient might be at risk for infection with the SARS-CoV-2 virus that causes COVID-19. Institutional protocols and algorithms that pertain to the evaluation of patients at risk for COVID-19 are in a state of rapid change based on information released by regulatory bodies including the CDC and federal and state organizations. These policies and algorithms were followed during the patient's care in the ED.  ____________________________________________   LABS (pertinent positives/negatives)  Labs Reviewed  BASIC METABOLIC PANEL - Abnormal; Notable for the following components:      Result Value   Glucose, Bld 181 (*)    BUN 28 (*)    All other components within normal limits  SARS CORONAVIRUS 2 (TAT 6-24 HRS)  CBC  TSH  T4, FREE  APTT  PROTIME-INR  TROPONIN I (HIGH SENSITIVITY)   CRITICAL CARE Performed by: Laurence Aly   Total critical care time: 30 minutes  Critical care time was exclusive of separately billable procedures and treating other patients.  Critical care was necessary to treat or prevent imminent or life-threatening deterioration.  Critical care was time spent personally by me on the following activities: development of treatment plan with patient and/or surrogate as well as nursing, discussions with consultants, evaluation of patient's response to treatment, examination of patient, obtaining history from patient or surrogate, ordering and performing treatments and interventions, ordering and review of laboratory studies, ordering and review of radiographic studies, pulse oximetry and re-evaluation of patient's condition.  RADIOLOGY Images were viewed by me  Chest x-ray IMPRESSION:  No edema or  consolidation. Cardiac silhouette within normal limits.  Aortic Atherosclerosis (ICD10-I70.0).  ____________________________________________   DIFFERENTIAL DIAGNOSIS   Atrial fibrillation, arrhythmia, MI, PE, valvular disease, anemia, occult infection  FINAL ASSESSMENT AND PLAN  Atrial fibrillation with rapid ventricular response   Plan: The patient had presented for A. fib with RVR.  Patient was started on 10 mg IV Cardizem and given IV fluids.  Patient's labs did not reveal any acute process. Patient's imaging was reassuring.  He received 2 doses of Cardizem with some improvement in his heart rate but still is in the 120s.  He will be placed on a Cardizem drip.  I will discuss with cardiology and admit to the hospitalist service.   Laurence Aly, MD    Note: This note was generated in part or whole with voice recognition software. Voice recognition is usually quite accurate but there are transcription errors that can and very often do occur. I apologize for any typographical errors that were not detected and corrected.     Earleen Newport, MD 03/29/19 [phone]

## 2019-03-29 NOTE — ED Triage Notes (Signed)
Pt arrived from home c/o irregular heartbeat. Pt stated heart was fluttering.

## 2019-03-29 NOTE — ED Notes (Signed)
Discussed pt with dr sudini and if ok to decrease gtt as tolerated by VS.  HR 65-90 on average occasionally hitting upper 90s but is dipping into 60s.  Remains in A-fib.  Will decrease gtt to 5mg /hr per dr sudini.

## 2019-03-29 NOTE — H&P (Signed)
Raymond Vazquez NAME: Raymond Vazquez    MR#:  [id number]  DATE OF BIRTH:  April 01, 1946  DATE OF ADMISSION:  03/29/2019  PRIMARY CARE PHYSICIAN: Kirk Ruths, MD   REQUESTING/REFERRING PHYSICIAN: Dr. Jimmye Norman  CHIEF COMPLAINT:   Chief Complaint  Patient presents with  . Irregular Heart Beat    HISTORY OF PRESENT ILLNESS:  Raymond Vazquez  is a 73 y.o. male with a known history of BPH and Bigeminy presented to ER today due to fluttering in the chest and SOB, nausea.No CP. Felt dizzy. He has had breif episodes of chest flutter lasting only a few seconds for many months. After arrival in ER he was found to have Afib with RVR. 2 doses of IV cardizem were given and HR continued to be in 130s. Started on cardizem drip. He feels better at this time. No change in meds recently. Rarely drinks alcohol. Doesn't smoke  PAST MEDICAL HISTORY:   Past Medical History:  Diagnosis Date  . Amblyopia, right eye   . Hypertrophy (benign) of prostate   . Intussusception ([place])     PAST SURGICAL HISTORY:   Past Surgical History:  Procedure Laterality Date  . COLON SURGERY    . COLONOSCOPY WITH PROPOFOL N/A 09/05/2015   Procedure: COLONOSCOPY WITH PROPOFOL;  Surgeon: Manya Silvas, MD;  Location[facility] ENDOSCOPY;  Service: Endoscopy;  Laterality: N/A;  . COLONOSCOPY WITH PROPOFOL N/A 01/26/2019   Procedure: COLONOSCOPY WITH PROPOFOL;  Surgeon: Lollie Sails, MD;  Location: [facility] ENDOSCOPY;  Service: Endoscopy;  Laterality: N/A;  . SMALL BOWEL REPAIR    . WISDOM TOOTH EXTRACTION      SOCIAL HISTORY:   Social History   Tobacco Use  . Smoking status: Never Smoker  . Smokeless tobacco: Never Used  Substance Use Topics  . Alcohol use: No    FAMILY HISTORY:  History reviewed. No pertinent family history. Prostate cancer, CAD DRUG ALLERGIES:  No Known Allergies  REVIEW OF SYSTEMS:   Review of Systems  Constitutional: Positive for  malaise/fatigue. Negative for chills, fever and weight loss.  HENT: Negative for hearing loss and nosebleeds.   Eyes: Negative for blurred vision, double vision and pain.  Respiratory: Positive for shortness of breath. Negative for cough, hemoptysis, sputum production and wheezing.   Cardiovascular: Positive for palpitations. Negative for chest pain, orthopnea and leg swelling.  Gastrointestinal: Positive for nausea. Negative for abdominal pain, constipation, diarrhea and vomiting.  Genitourinary: Negative for dysuria and hematuria.  Musculoskeletal: Negative for back pain, falls and myalgias.  Skin: Negative for rash.  Neurological: Positive for dizziness. Negative for tremors, sensory change, speech change, focal weakness, seizures and headaches.  Endo/Heme/Allergies: Does not bruise/bleed easily.  Psychiatric/Behavioral: Negative for depression and memory loss. The patient is not nervous/anxious.     MEDICATIONS AT HOME:   Prior to Admission medications   Medication Sig Start Date End Date Taking? Authorizing Provider  dutasteride (AVODART) 0.5 MG capsule Take 0.5 mg by mouth daily.   Yes [provider]  Multiple Vitamin (MULTIVITAMIN) tablet Take 1 tablet by mouth daily.   Yes [provider]  tamsulosin (FLOMAX) 0.4 MG CAPS capsule Take 0.4 mg by mouth daily.   Yes [provider]     VITAL SIGNS:  Blood pressure 126/75, pulse (!) 123, temperature 97.9 F (36.6 C), temperature source Oral, resp. rate 16, height 5\' 9"  (1.753 m), weight 72.6 kg, SpO2 96 %.  PHYSICAL EXAMINATION:  Physical  Exam  GENERAL:  73 y.o.-year-old patient lying in the bed with no acute distress.  EYES: Pupils equal, round, reactive to light and accommodation. No scleral icterus. Extraocular muscles intact.  HEENT: Head atraumatic, normocephalic. Oropharynx and nasopharynx clear. No oropharyngeal erythema, moist oral mucosa  NECK:  Supple, no jugular venous distention. No thyroid  enlargement, no tenderness.  LUNGS: Normal breath sounds bilaterally, no wheezing, rales, rhonchi. No use of accessory muscles of respiration.  CARDIOVASCULAR: Irregularly irregular. Tachycardic ABDOMEN: Soft, nontender, nondistended. Bowel sounds present. No organomegaly or mass.  EXTREMITIES: No pedal edema, cyanosis, or clubbing. + 2 pedal & radial pulses b/l.   NEUROLOGIC: Cranial nerves II through XII are intact. No focal Motor or sensory deficits appreciated b/l PSYCHIATRIC: The patient is alert and oriented x 3. Good affect.  SKIN: No obvious rash, lesion, or ulcer.   LABORATORY PANEL:   CBC Recent Labs  Lab 03/29/19 1042  WBC 6.3  HGB 16.0  HCT 49.0  PLT 215   ------------------------------------------------------------------------------------------------------------------  Chemistries  Recent Labs  Lab 03/29/19 1042  NA 141  K 3.9  CL 105  CO2 24  GLUCOSE 181*  BUN 28*  CREATININE 0.93  CALCIUM 9.3   ------------------------------------------------------------------------------------------------------------------  Cardiac Enzymes No results for input(s): TROPONINI in the last 168 hours. ------------------------------------------------------------------------------------------------------------------  RADIOLOGY:  Dg Chest Port 1 View  Result Date: 03/29/2019 CLINICAL DATA:  Chest pain and shortness of breath. Cardiac arrhythmia. EXAM: PORTABLE CHEST 1 VIEW COMPARISON:  None. FINDINGS: There is no edema or consolidation. Heart size and pulmonary vascularity are normal. No adenopathy. No pneumothorax. There is degenerative change in each shoulder. There is aortic atherosclerosis. IMPRESSION: No edema or consolidation. Cardiac silhouette within normal limits. Aortic Atherosclerosis (ICD10-I70.0). Electronically Signed   By: Lowella Grip III M.D.   On: 03/29/2019 10:51   IMPRESSION AND PLAN:   * Afib with RVR Likely has had Afib for some time due to  fluttering episodes in the past.  Start cardizem drip and admit inpatient to Telemetry unit. Eliquis started by cardiology. Check TSH, Echo  * BPH Continue Home meds  * Hyperglycemia No h/o DM Check HbA1c  * DVT prophylaxis On Eliquis  All the records are reviewed and case discussed with ED provider. Management plans discussed with the patient, family and they are in agreement.  CODE STATUS: FULL CODE  TOTAL TIME TAKING CARE OF THIS PATIENT: 35 minutes.   Leia Alf Jalaya Sarver M.D on 03/29/2019 at 12:42 PM  Between 7am to 6pm - Pager - [phone]  After 6pm go to [email or web address] - password [email or web address] [facility]  Office  [phone]  CC: Primary care physician; Kirk Ruths, MD  Note: This dictation was prepared with Dragon dictation along with smaller phrase technology. Any transcriptional errors that result from this process are unintentional.

## 2019-03-29 NOTE — ED Notes (Signed)
ED TO INPATIENT HANDOFF REPORT  ED Nurse Name and Phone #: [place] Levels Name/Age/Gender Raymond Vazquez. 73 y.o. male Room/Bed: ED08A/ED08A  Code Status   Code Status: Full Code  Home/SNF/Other Home Patient oriented to: self, place, time and situation Is this baseline? Yes   Triage Complete: Triage complete  Chief Complaint irregular heart beat   Triage Note Pt arrived from home c/o irregular heartbeat. Pt stated heart was fluttering.     Allergies No Known Allergies  Level of Care/Admitting Diagnosis ED Disposition    ED Disposition Condition [facility] Area: [place] [[id number]]  Level of Care: Telemetry [5]  Covid Evaluation: Asymptomatic Screening Protocol (No Symptoms)  Diagnosis: Atrial fibrillation ([place]) [427.31.ICD-9-CM]  Admitting Physician: Hillary Bow [id number]  Attending Physician: Hillary Bow [id number]  Estimated length of stay: past midnight tomorrow  Certification:: I certify this patient will need inpatient services for at least 2 midnights  PT Class (Do Not Modify): Inpatient [101]  PT Acc Code (Do Not Modify): Private [1]       B Medical/Surgery History Past Medical History:  Diagnosis Date  . Amblyopia, right eye   . Hypertrophy (benign) of prostate   . Intussusception [facility])    Past Surgical History:  Procedure Laterality Date  . COLON SURGERY    . COLONOSCOPY WITH PROPOFOL N/A 09/05/2015   Procedure: COLONOSCOPY WITH PROPOFOL;  Surgeon: Manya Silvas, MD;  Location: [facility] ENDOSCOPY;  Service: Endoscopy;  Laterality: N/A;  . COLONOSCOPY WITH PROPOFOL N/A 01/26/2019   Procedure: COLONOSCOPY WITH PROPOFOL;  Surgeon: Lollie Sails, MD;  Location: [facility] ENDOSCOPY;  Service: Endoscopy;  Laterality: N/A;  . SMALL BOWEL REPAIR    . WISDOM TOOTH EXTRACTION       A IV Location/Drains/Wounds Patient Lines/Drains/Airways Status   Active Line/Drains/Airways    Name:   Placement date:   Placement  time:   Site:   Days:   Peripheral IV 03/29/19 Left Forearm   03/29/19    1030    Forearm   less than 1   Peripheral IV 03/29/19 Left Hand   03/29/19    1200    Hand   less than 1          Intake/Output Last 24 hours No intake or output data in the 24 hours ending 03/29/19 1956  Labs/Imaging Results for orders placed or performed during the hospital encounter of 03/29/19 (from the past 48 hour(s))  Basic metabolic panel     Status: Abnormal   Collection Time: 03/29/19 10:42 AM  Result Value Ref Range   Sodium 141 135 - 145 mmol/L   Potassium 3.9 3.5 - 5.1 mmol/L   Chloride 105 98 - 111 mmol/L   CO2 24 22 - 32 mmol/L   Glucose, Bld 181 (H) 70 - 99 mg/dL   BUN 28 (H) 8 - 23 mg/dL   Creatinine, Ser 0.93 0.61 - 1.24 mg/dL   Calcium 9.3 8.9 - 10.3 mg/dL   GFR calc non Af Amer >60 >60 mL/min   GFR calc Af Amer >60 >60 mL/min   Anion gap 12 5 - 15    Comment: Performed at [facility], [address] [place],  [address]  CBC     Status: None   Collection Time: 03/29/19 10:42 AM  Result Value Ref Range   WBC 6.3 4.0 - 10.5 K/uL   RBC 5.64 4.22 - 5.81 MIL/uL   Hemoglobin 16.0 13.0 -  17.0 g/dL   HCT 49.0 39.0 - 52.0 %   MCV 86.9 80.0 - 100.0 fL   MCH 28.4 26.0 - 34.0 pg   MCHC 32.7 30.0 - 36.0 g/dL   RDW 12.3 11.5 - 15.5 %   Platelets 215 150 - 400 K/uL   nRBC 0.0 0.0 - 0.2 %    Comment: Performed at [facility], [place], [place] [address]  Troponin I (High Sensitivity)     Status: None   Collection Time: 03/29/19 10:42 AM  Result Value Ref Range   Troponin I (High Sensitivity) 4 <18 ng/L    Comment: (NOTE) Elevated high sensitivity troponin I (hsTnI) values and significant  changes across serial measurements may suggest ACS but many other  chronic and acute conditions are known to elevate hsTnI results.  Refer to the Links section for chest pain algorithms and additional  guidance. Performed at [facility], [address] [place], [place] [address]   TSH     Status: None   Collection Time: 03/29/19 10:42 AM  Result Value Ref Range   TSH 1.905 0.350 - 4.500 uIU/mL    Comment: Performed by a 3rd Generation assay with a functional sensitivity of <=0.01 uIU/mL. Performed at [facility], [address] [place], [place] [address]   T4, free     Status: None   Collection Time: 03/29/19 10:42 AM  Result Value Ref Range   Free T4 0.91 0.61 - 1.12 ng/dL    Comment: (NOTE) Biotin ingestion may interfere with free T4 tests. If the results are inconsistent with the TSH level, previous test results, or the clinical presentation, then consider biotin interference. If needed, order repeat testing after stopping biotin. Performed at [facility], [place], [place] [address]   SARS CORONAVIRUS 2 (TAT 6-24 HRS) Nasopharyngeal Nasopharyngeal Swab     Status: None   Collection Time: 03/29/19 10:42 AM   Specimen: Nasopharyngeal Swab  Result Value Ref Range   SARS Coronavirus 2 NEGATIVE NEGATIVE    Comment: (NOTE) SARS-CoV-2 target nucleic acids are NOT DETECTED. The SARS-CoV-2 RNA is generally detectable in upper and lower respiratory specimens during the acute phase of infection. Negative results do not preclude SARS-CoV-2 infection, do not rule out co-infections with other pathogens, and should not be used as the sole basis for treatment or other patient management decisions. Negative results must be combined with clinical observations, patient history, and epidemiological information. The expected result is Negative. Fact Sheet for Patients: SugarRoll.be Fact Sheet for Healthcare Providers: https://www.woods-mathews.com/ This test is not yet approved or cleared by the [place] FDA and  has been authorized for detection and/or diagnosis of SARS-CoV-2 by FDA under an Emergency Use Authorization (EUA). This EUA will remain  in  effect (meaning this test can be used) for the duration of the COVID-19 declaration under Section 56 4(b)(1) of the Act, 21 U.S.C. section 360bbb-3(b)(1), unless the authorization is terminated or revoked sooner. Performed at [facility], [place] [address] [place], [place] [address]   APTT     Status: None   Collection Time: 03/29/19 10:42 AM  Result Value Ref Range   aPTT 28 24 - 36 seconds    Comment: Performed at [facility], [address] [place], [place] [address]  Protime-INR     Status: None   Collection Time: 03/29/19 10:42 AM  Result Value Ref Range   Prothrombin Time 12.3 11.4 - 15.2  seconds   INR 0.9 0.8 - 1.2    Comment: (NOTE) INR goal varies based on device and disease states. Performed at [facility], [address], [place], [place] [address]    Dg Chest Port 1 View  Result Date: 03/29/2019 CLINICAL DATA:  Chest pain and shortness of breath. Cardiac arrhythmia. EXAM: PORTABLE CHEST 1 VIEW COMPARISON:  None. FINDINGS: There is no edema or consolidation. Heart size and pulmonary vascularity are normal. No adenopathy. No pneumothorax. There is degenerative change in each shoulder. There is aortic atherosclerosis. IMPRESSION: No edema or consolidation. Cardiac silhouette within normal limits. Aortic Atherosclerosis (ICD10-I70.0). Electronically Signed   By: Lowella Grip III M.D.   On: 03/29/2019 10:51    Pending Labs Unresulted Labs (From admission, onward)    Start     Ordered   03/29/19 1944  Hemoglobin A1c  Add-on,   AD     03/29/19 1943          Vitals/Pain Today's Vitals   03/29/19 1816 03/29/19 1829 03/29/19 1830 03/29/19 1930  BP:   117/62 111/72  Pulse: 80 73 85 68  Resp: 18 17 18 15   Temp:      TempSrc:      SpO2: 97% 100% 99% 95%  Weight:      Height:      PainSc:        Isolation Precautions No active isolations  Medications Medications  diltiazem (CARDIZEM) 100 mg in dextrose 5 % 100 mL (1 mg/mL)  infusion (0 mg/hr Intravenous Stopped 03/29/19 1755)  apixaban (ELIQUIS) tablet 5 mg (5 mg Oral Given 03/29/19 1244)  diltiazem (CARDIZEM CD) 24 hr capsule 240 mg (240 mg Oral Given 03/29/19 1317)  sodium chloride flush (NS) 0.9 % injection 3 mL (3 mLs Intravenous Not Given 03/29/19 1256)  acetaminophen (TYLENOL) tablet 650 mg (has no administration in time range)    Or  acetaminophen (TYLENOL) suppository 650 mg (has no administration in time range)  ondansetron (ZOFRAN) tablet 4 mg (has no administration in time range)    Or  ondansetron (ZOFRAN) injection 4 mg (has no administration in time range)  polyethylene glycol (MIRALAX / GLYCOLAX) packet 17 g (has no administration in time range)  diltiazem (CARDIZEM) injection 10 mg (10 mg Intravenous Given 03/29/19 1033)  0.9 %  sodium chloride infusion ( Intravenous Stopped 03/29/19 1256)  diltiazem (CARDIZEM) injection 20 mg (20 mg Intravenous Given 03/29/19 1057)    Mobility walks with person assist Low fall risk   Focused Assessments Cardiac Assessment Handoff:  Cardiac Rhythm: Atrial fibrillation No results found for: CKTOTAL, CKMB, CKMBINDEX, TROPONINI No results found for: DDIMER Does the Patient currently have chest pain? No      R Recommendations: See Admitting Provider Note  Report given to:   Additional Notes:

## 2019-03-29 NOTE — ED Notes (Signed)
Per Dr. Flint Melter to increase dilt drip to 15mg /hr.

## 2019-03-29 NOTE — Consult Note (Signed)
[facility] Consultation Note  Patient ID: Raymond Vazquez., MRN: [id number], DOB/AGE: Sep 22, 1945 73 y.o. Admit date: 03/29/2019   Date of Consult: 03/29/2019 Primary Physician: Kirk Ruths, MD Primary Cardiologist: None  Chief Complaint:  Chief Complaint  Patient presents with  . Irregular Heart Beat   Reason for Consult: Atrial fibrillation  HPI: 73 y.o. male with no evidence of cardiovascular concerns or cardiovascular symptoms having new onset of palpitations irregular heartbeat dizziness while doing the dishes in the morning.  This lasted for quite some time and he was seen in the emergency room.  At that time the patient had EKG showing atrial fibrillation with rapid ventricular rate with no other EKG changes.  The patient was placed on the diltiazem drip with intravenous diltiazem with reasonable heart rate control now to 122 bpm.  The patient is comfortable at this time but still with rapid rate but no evidence of chest pain weakness fatigue or other congestive heart failure type symptoms.  Previously he has been a very active person with no evidence of symptoms of angina or other concerns.  There is been no other risk factors for cardiovascular disease either.  The patient therefore is at low risk for cardiovascular complications with aggressive medication management for atrial fibrillation treatment  Past Medical History:  Diagnosis Date  . Amblyopia, right eye   . Hypertrophy (benign) of prostate   . Intussusception [facility])       Surgical History:  Past Surgical History:  Procedure Laterality Date  . COLON SURGERY    . COLONOSCOPY WITH PROPOFOL N/A 09/05/2015   Procedure: COLONOSCOPY WITH PROPOFOL;  Surgeon: Manya Silvas, MD;  Location: [facility] ENDOSCOPY;  Service: Endoscopy;  Laterality: N/A;  . COLONOSCOPY WITH PROPOFOL N/A 01/26/2019   Procedure: COLONOSCOPY WITH PROPOFOL;  Surgeon: Lollie Sails, MD;  Location: [facility] ENDOSCOPY;  Service:  Endoscopy;  Laterality: N/A;  . SMALL BOWEL REPAIR    . WISDOM TOOTH EXTRACTION       Home Meds: Prior to Admission medications   Medication Sig Start Date End Date Taking? Authorizing Provider  dutasteride (AVODART) 0.5 MG capsule Take 0.5 mg by mouth daily.   Yes [provider]  Multiple Vitamin (MULTIVITAMIN) tablet Take 1 tablet by mouth daily.   Yes [provider]  tamsulosin (FLOMAX) 0.4 MG CAPS capsule Take 0.4 mg by mouth daily.   Yes [provider]    Inpatient Medications:  . apixaban  5 mg Oral BID  . diltiazem  240 mg Oral Daily  . sodium chloride flush  3 mL Intravenous Q12H   . diltiazem (CARDIZEM) infusion 5 mg/hr (03/29/19 1208)    Allergies: No Known Allergies  Social History   Socioeconomic History  . Marital status: Married    Spouse name: Not on file  . Number of children: Not on file  . Years of education: Not on file  . Highest education level: Not on file  Occupational History  . Not on file  Social Needs  . Financial resource strain: Not on file  . Food insecurity    Worry: Not on file    Inability: Not on file  . Transportation needs    Medical: Not on file    Non-medical: Not on file  Tobacco Use  . Smoking status: Never Smoker  . Smokeless tobacco: Never Used  Substance and Sexual Activity  . Alcohol use: No  . Drug use: Never  . Sexual activity: Not on file  Lifestyle  . Physical activity    Days per week: Not on file    Minutes per session: Not on file  . Stress: Not on file  Relationships  . Social Herbalist on phone: Not on file    Gets together: Not on file    Attends religious service: Not on file    Active member of club or organization: Not on file    Attends meetings of clubs or organizations: Not on file    Relationship status: Not on file  . Intimate partner violence    Fear of current or ex partner: Not on file    Emotionally abused: Not on file    Physically abused: Not on  file    Forced sexual activity: Not on file  Other Topics Concern  . Not on file  Social History Narrative  . Not on file     History reviewed. No pertinent family history.   Review of Systems Positive for palpitations irregular heartbeat Negative for: General:  chills, fever, night sweats or weight changes.  Cardiovascular: PND orthopnea syncope dizziness  Dermatological skin lesions rashes Respiratory: Cough congestion Urologic: Frequent urination urination at night and hematuria Abdominal: negative for nausea, vomiting, diarrhea, bright red blood per rectum, melena, or hematemesis Neurologic: negative for visual changes, and/or hearing changes  All other systems reviewed and are otherwise negative except as noted above.  Labs: No results for input(s): CKTOTAL, CKMB, TROPONINI in the last 72 hours. Lab Results  Component Value Date   WBC 6.3 03/29/2019   HGB 16.0 03/29/2019   HCT 49.0 03/29/2019   MCV 86.9 03/29/2019   PLT 215 03/29/2019    Recent Labs  Lab 03/29/19 1042  NA 141  K 3.9  CL 105  CO2 24  BUN 28*  CREATININE 0.93  CALCIUM 9.3  GLUCOSE 181*   No results found for: CHOL, HDL, LDLCALC, TRIG No results found for: DDIMER  Radiology/Studies:  Dg Chest Port 1 View  Result Date: 03/29/2019 CLINICAL DATA:  Chest pain and shortness of breath. Cardiac arrhythmia. EXAM: PORTABLE CHEST 1 VIEW COMPARISON:  None. FINDINGS: There is no edema or consolidation. Heart size and pulmonary vascularity are normal. No adenopathy. No pneumothorax. There is degenerative change in each shoulder. There is aortic atherosclerosis. IMPRESSION: No edema or consolidation. Cardiac silhouette within normal limits. Aortic Atherosclerosis (ICD10-I70.0). Electronically Signed   By: Lowella Grip III M.D.   On: 03/29/2019 10:51    EKG: Atrial fibrillation rapid ventricular rate  Weights: Filed Weights   03/29/19 1028  Weight: 72.6 kg     Physical Exam: Blood pressure (!)  145/86, pulse 79, temperature 97.9 F (36.6 C), temperature source Oral, resp. rate 20, height 5\' 9"  (1.753 m), weight 72.6 kg, SpO2 97 %. Body mass index is 23.63 kg/m. General: Well developed, well nourished, in no acute distress. Head eyes ears nose throat: Normocephalic, atraumatic, sclera non-icteric, no xanthomas, nares are without discharge. No apparent thyromegaly and/or mass  Lungs: Normal respiratory effort.  no wheezes, no rales, no rhonchi.  Heart: Irregular with normal S1 S2. no murmur gallop, no rub, PMI is normal size and placement, carotid upstroke normal without bruit, jugular venous pressure is normal Abdomen: Soft, non-tender, non-distended with normoactive bowel sounds. No hepatomegaly. No rebound/guarding. No obvious abdominal masses. Abdominal aorta is normal size without bruit Extremities: No edema. no cyanosis, no clubbing, no ulcers  Peripheral : 2+ bilateral upper extremity pulses, 2+ bilateral femoral pulses, 2+ bilateral  dorsal pedal pulse Neuro: Alert and oriented. No facial asymmetry. No focal deficit. Moves all extremities spontaneously. Musculoskeletal: Normal muscle tone without kyphosis Psych:  Responds to questions appropriately with a normal affect.    Assessment: 73 year old male with no evidence of cardiovascular concerns or symptoms having new onset atrial fibrillation with rapid ventricular rate without evidence of heart failure or myocardial infarction  Plan: 1.  Diltiazem drip for heart rate control 2.  Oral diltiazem 240mg  CD for heart rate control and possible spontaneous conversion to normal rhythm 3.  Eliquis for further risk reduction in stroke with atrial fibrillation 4.  Further consideration of flecainide use for possible medical conversion to normal sinus rhythm if necessary 5.  Other cardiovascular diagnostic testing and treatment options after above  Signed, Corey Skains M.D. [facility] 03/29/2019, 12:49 PM

## 2019-03-29 NOTE — ED Notes (Signed)
Waiting on admit room. Rate controlled. Remains in afib.

## 2019-03-30 ENCOUNTER — Inpatient Hospital Stay
Admit: 2019-03-30 | Discharge: 2019-03-30 | Disposition: A | Discharge status: Home / Self Care | Payer: Medicare Other | Attending: Internal Medicine | Admitting: Internal Medicine

## 2019-03-30 LAB — ECHOCARDIOGRAM COMPLETE
Height: 69 in
Weight: 2468.8 oz

## 2019-03-30 MED ORDER — DILTIAZEM HCL ER COATED BEADS 120 MG PO CP24: 120.0000 mg | ORAL_CAPSULE | Freq: Every day | ORAL | 0 refills | Status: DC

## 2019-03-30 MED ORDER — APIXABAN 5 MG PO TABS: 5.0000 mg | ORAL_TABLET | Freq: Two times a day (BID) | ORAL | 0 refills | Status: DC

## 2019-03-30 NOTE — Progress Notes (Signed)
[facility] Encounter Note  Patient: Raymond Vazquez. / Admit Date: 03/29/2019 / Date of Encounter: 03/30/2019, 8:32 AM   Subjective: Patient feeling much better today with no further episodes of palpitations or irregular heartbeat.  Patient spontaneously converted to normal sinus rhythm with appropriate medication management including intravenous and oral diltiazem.  Patient has tolerated anticoagulation.  No evidence of myocardial infarction congestive heart failure or other symptoms  Review of Systems: Positive for: None Negative for: Vision change, hearing change, syncope, dizziness, nausea, vomiting,diarrhea, bloody stool, stomach pain, cough, congestion, diaphoresis, urinary frequency, urinary pain,skin lesions, skin rashes Others previously listed  Objective: Telemetry: Normal sinus rhythm Physical Exam: Blood pressure 132/75, pulse 65, temperature 98.4 F (36.9 C), temperature source Oral, resp. rate 19, height 5\' 9"  (1.753 m), weight 70 kg, SpO2 97 %. Body mass index is 22.79 kg/m. General: Well developed, well nourished, in no acute distress. Head: Normocephalic, atraumatic, sclera non-icteric, no xanthomas, nares are without discharge. Neck: No apparent masses Lungs: Normal respirations with no wheezes, no rhonchi, no rales , no crackles   Heart: Regular rate and rhythm, normal S1 S2, no murmur, no rub, no gallop, PMI is normal size and placement, carotid upstroke normal without bruit, jugular venous pressure normal Abdomen: Soft, non-tender, non-distended with normoactive bowel sounds. No hepatosplenomegaly. Abdominal aorta is normal size without bruit Extremities: No edema, no clubbing, no cyanosis, no ulcers,  Peripheral: 2+ radial, 2+ femoral, 2+ dorsal pedal pulses Neuro: Alert and oriented. Moves all extremities spontaneously. Psych:  Responds to questions appropriately with a normal affect.   Intake/Output Summary (Last 24 hours) at  03/30/2019 [id number] Last data filed at 03/30/2019 0300 Gross per 24 hour  Intake 0 ml  Output 0 ml  Net 0 ml    Inpatient Medications:  . apixaban  5 mg Oral BID  . diltiazem  240 mg Oral Daily  . dutasteride  0.5 mg Oral Daily  . sodium chloride flush  3 mL Intravenous Q12H  . tamsulosin  0.4 mg Oral Daily   Infusions:  . diltiazem (CARDIZEM) infusion Stopped (03/29/19 1755)    Labs: Recent Labs    03/29/19 1042  NA 141  K 3.9  CL 105  CO2 24  GLUCOSE 181*  BUN 28*  CREATININE 0.93  CALCIUM 9.3   No results for input(s): AST, ALT, ALKPHOS, BILITOT, PROT, ALBUMIN in the last 72 hours. Recent Labs    03/29/19 1042  WBC 6.3  HGB 16.0  HCT 49.0  MCV 86.9  PLT 215   No results for input(s): CKTOTAL, CKMB, TROPONINI in the last 72 hours. Invalid input(s): POCBNP Recent Labs    03/29/19 1042  HGBA1C 5.6     Weights: Filed Weights   03/29/19 1028 03/29/19 2116  Weight: 72.6 kg 70 kg     Radiology/Studies:  Dg Chest Port 1 View  Result Date: 03/29/2019 CLINICAL DATA:  Chest pain and shortness of breath. Cardiac arrhythmia. EXAM: PORTABLE CHEST 1 VIEW COMPARISON:  None. FINDINGS: There is no edema or consolidation. Heart size and pulmonary vascularity are normal. No adenopathy. No pneumothorax. There is degenerative change in each shoulder. There is aortic atherosclerosis. IMPRESSION: No edema or consolidation. Cardiac silhouette within normal limits. Aortic Atherosclerosis (ICD10-I70.0). Electronically Signed   By: Lowella Grip III M.D.   On: 03/29/2019 10:51     Assessment and Recommendation  73 y.o. male with acute onset of atrial fibrillation rapid ventricular rate no certain etiology with no evidence of  myocardial infarction or congestive heart failure now in normal rhythm with appropriate medication management 1.  Continue diltiazem orally long-acting at 120 mg each day at discharge 2.  Anticoagulation with Eliquis 5 mg twice per day for 1 month 3.   No further cardiac diagnostics necessary at this time and will further evaluate as outpatient 4.  Okay for discharged home this a.m. after ambulating well and will follow-up next week for further outpatient evaluation if necessary  Signed, Serafina Royals M.D. FACC

## 2019-03-30 NOTE — Care Management Obs Status (Signed)
[facility] NOTIFICATION   Patient Details  Name: Raymond Vazquez. MRN: [id number] Date of Birth: [birth year]   Medicare Observation Status Notification Given:  Yes    Shenell Rogalski A Lynley Killilea, RN 03/30/2019, 10:26 AM

## 2019-03-30 NOTE — Progress Notes (Signed)
*  PRELIMINARY RESULTS* Echocardiogram 2D Echocardiogram has been performed.  Raymond Vazquez 03/30/2019, 9:06 AM

## 2019-03-30 NOTE — Care Management CC44 (Signed)
Condition Code 44 Documentation Completed  Patient Details  Name: Raymond Vazquez. MRN: [id number] Date of Birth: 07-25-1945   Condition Code 44 given:  Yes Patient signature on Condition Code 44 notice:  Yes Documentation of 2 MD's agreement:  Yes Code 44 added to claim:  Yes    Latanya Maudlin, RN 03/30/2019, 10:26 AM

## 2019-03-31 NOTE — Discharge Summary (Signed)
[facility] [place] NAME: Raymond Vazquez    MR#:  [id number]  DATE OF BIRTH:  [birth year]  DATE OF ADMISSION:  03/29/2019 ADMITTING PHYSICIAN: Hillary Bow, MD  DATE OF DISCHARGE: 03/30/2019 12:44 PM  PRIMARY CARE PHYSICIAN: Kirk Ruths, MD   ADMISSION DIAGNOSIS:  Atrial fibrillation with rapid ventricular response ([place]) [I48.91]  DISCHARGE DIAGNOSIS:  Active Problems:   Atrial fibrillation ([place])   SECONDARY DIAGNOSIS:   Past Medical History:  Diagnosis Date  . Amblyopia, right eye   . Hypertrophy (benign) of prostate   . Intussusception ([place])      ADMITTING HISTORY  HISTORY OF PRESENT ILLNESS:  Raymond Vazquez  is a 73 y.o. male with a known history of BPH and Bigeminy presented to ER today due to fluttering in the chest and SOB, nausea.No CP. Felt dizzy. He has had breif episodes of chest flutter lasting only a few seconds for many months. After arrival in ER he was found to have Afib with RVR. 2 doses of IV cardizem were given and HR continued to be in 130s. Started on cardizem drip. He feels better at this time. No change in meds recently. Rarely drinks alcohol. Doesn't smoke  HOSPITAL COURSE:   *Atrial fibrillation with rapid ventricular rate.  Patient was given IV Cardizem boluses and started on a Cardizem drip in the ER.  Started on oral Cardizem by cardiology along with Eliquis.  Patient converted to normal sinus rhythm and by day of discharge his heart rate is stable even on ambulation.  Seen by cardiology.  Echocardiogram was done and report pending.  Patient will follow up with Dr. Nehemiah Massed of cardiology team in a week.  Return instructions given.  Was noticed to have hyperglycemia on admission but hemoglobin A1c is 5.6.  Likely acute reaction.  Stable for discharge home.  Prescription sent to pharmacy. Cardizem and Eliquis  CONSULTS OBTAINED:   Cardiology Dr. Nehemiah Massed  DRUG ALLERGIES:  No Known  Allergies  DISCHARGE MEDICATIONS:   Allergies as of 03/30/2019   No Known Allergies     Medication List    TAKE these medications   apixaban 5 MG Tabs tablet Commonly known as: ELIQUIS Take 1 tablet (5 mg total) by mouth 2 (two) times daily.   diltiazem 120 MG 24 hr capsule Commonly known as: CARDIZEM CD Take 1 capsule (120 mg total) by mouth daily.   dutasteride 0.5 MG capsule Commonly known as: AVODART Take 0.5 mg by mouth daily.   multivitamin tablet Take 1 tablet by mouth daily.   tamsulosin 0.4 MG Caps capsule Commonly known as: FLOMAX Take 0.4 mg by mouth daily.       Today   VITAL SIGNS:  Blood pressure 132/75, pulse 65, temperature 98.4 F (36.9 C), temperature source Oral, resp. rate 19, height 5\' 9"  (1.753 m), weight 70 kg, SpO2 97 %.  I/O:  No intake or output data in the 24 hours ending 03/31/19 1339  PHYSICAL EXAMINATION:  Physical Exam  GENERAL:  73 y.o.-year-old patient lying in the bed with no acute distress.  LUNGS: Normal breath sounds bilaterally, no wheezing, rales,rhonchi or crepitation. No use of accessory muscles of respiration.  CARDIOVASCULAR: S1, S2 normal. No murmurs, rubs, or gallops.  ABDOMEN: Soft, non-tender, non-distended. Bowel sounds present. No organomegaly or mass.  NEUROLOGIC: Moves all 4 extremities. PSYCHIATRIC: The patient is alert and oriented x 3.  SKIN: No obvious rash, lesion, or ulcer.   DATA REVIEW:  CBC Recent Labs  Lab 03/29/19 1042  WBC 6.3  HGB 16.0  HCT 49.0  PLT 215    Chemistries  Recent Labs  Lab 03/29/19 1042  NA 141  K 3.9  CL 105  CO2 24  GLUCOSE 181*  BUN 28*  CREATININE 0.93  CALCIUM 9.3    Cardiac Enzymes No results for input(s): TROPONINI in the last 168 hours.  Microbiology Results  Results for orders placed or performed during the hospital encounter of 03/29/19  SARS CORONAVIRUS 2 (TAT 6-24 HRS) Nasopharyngeal Nasopharyngeal Swab     Status: None   Collection Time:  03/29/19 10:42 AM   Specimen: Nasopharyngeal Swab  Result Value Ref Range Status   SARS Coronavirus 2 NEGATIVE NEGATIVE Final    Comment: (NOTE) SARS-CoV-2 target nucleic acids are NOT DETECTED. The SARS-CoV-2 RNA is generally detectable in upper and lower respiratory specimens during the acute phase of infection. Negative results do not preclude SARS-CoV-2 infection, do not rule out co-infections with other pathogens, and should not be used as the sole basis for treatment or other patient management decisions. Negative results must be combined with clinical observations, patient history, and epidemiological information. The expected result is Negative. Fact Sheet for Patients: SugarRoll.be Fact Sheet for Healthcare Providers: https://www.woods-mathews.com/ This test is not yet approved or cleared by the [place] FDA and  has been authorized for detection and/or diagnosis of SARS-CoV-2 by FDA under an Emergency Use Authorization (EUA). This EUA will remain  in effect (meaning this test can be used) for the duration of the COVID-19 declaration under Section 56 4(b)(1) of the Act, 21 U.S.C. section 360bbb-3(b)(1), unless the authorization is terminated or revoked sooner. Performed at [facility], [place] [address] [place], [place] [address]     RADIOLOGY:  No results found.  Follow up with PCP in 1 week.  Management plans discussed with the patient, family and they are in agreement.  CODE STATUS:  Code Status History    Date Active Date Inactive Code Status Order ID Comments User Context   03/29/2019 1240 03/30/2019 1841 Full Code [id number]  Hillary Bow, MD ED   Advance Care Planning Activity    Advance Directive Documentation     Most Recent Value  Type of Advance Directive  Healthcare Power of Attorney, Living will  Pre-existing out of facility DNR order (yellow form or pink MOST form)  -  "MOST" Form in Place?  -       TOTAL TIME TAKING CARE OF THIS PATIENT ON DAY OF DISCHARGE: more than 30 minutes.   Leia Alf Jenah Vanasten M.D on 03/31/2019 at 1:39 PM  Between 7am to 6pm - Pager - [phone]  After 6pm go to [email or web address] - password [email or web address] [facility]  Office  [phone]  CC: Primary care physician; Kirk Ruths, MD  Note: This dictation was prepared with Dragon dictation along with smaller phrase technology. Any transcriptional errors that result from this process are unintentional.

## 2019-04-20 ENCOUNTER — Other Ambulatory Visit: Payer: Self-pay | Admitting: Surgery

## 2019-04-20 DIAGNOSIS — M75121 Complete rotator cuff tear or rupture of right shoulder, not specified as traumatic: Secondary | ICD-10-CM

## 2019-05-08 ENCOUNTER — Ambulatory Visit
Admission: RE | Admit: 2019-05-08 | Discharge: 2019-05-08 | Disposition: A | Discharge status: Home / Self Care | Payer: Medicare Other | Source: Ambulatory Visit | Attending: Surgery | Admitting: Surgery

## 2019-05-08 ENCOUNTER — Other Ambulatory Visit: Payer: Self-pay

## 2019-05-08 DIAGNOSIS — M75121 Complete rotator cuff tear or rupture of right shoulder, not specified as traumatic: Secondary | ICD-10-CM | POA: Diagnosis present

## 2019-06-13 ENCOUNTER — Other Ambulatory Visit: Payer: Self-pay | Admitting: Surgery

## 2019-06-15 ENCOUNTER — Encounter
Admission: RE | Admit: 2019-06-15 | Discharge: 2019-06-15 | Disposition: A | Discharge status: Home / Self Care | Payer: Medicare Other | Source: Ambulatory Visit | Attending: Surgery | Admitting: Surgery

## 2019-06-15 ENCOUNTER — Other Ambulatory Visit: Payer: Self-pay

## 2019-06-15 HISTORY — DX: Other complications of anesthesia, initial encounter: T88.59XA

## 2019-06-15 HISTORY — DX: Unspecified atrial fibrillation: I48.91

## 2019-06-15 NOTE — Patient Instructions (Signed)
Your procedure is scheduled on: 06/21/19 Report to [facility]. To find out your arrival time please call [phone] between 1PM - 3PM on 06/20/19.  Remember: Instructions that are not followed completely may result in serious medical risk, up to and including death, or upon the discretion of your surgeon and anesthesiologist your surgery may need to be rescheduled.     _X__ 1. Do not eat food after midnight the night before your procedure.                 No gum chewing or hard candies. You may drink clear liquids up to 2 hours                 before you are scheduled to arrive for your surgery- DO not drink clear                 liquids within 2 hours of the start of your surgery.                 Clear Liquids include:  water, apple juice without pulp, clear carbohydrate                 drink such as Clearfast or Gatorade, Black Coffee or Tea (Do not add                 anything to coffee or tea). Diabetics water only  __X__2.  On the morning of surgery brush your teeth with toothpaste and water, you                 may rinse your mouth with mouthwash if you wish.  Do not swallow any              toothpaste of mouthwash.     _X__ 3.  No Alcohol for 24 hours before or after surgery.   _X__ 4.  Do Not Smoke or use e-cigarettes For 24 Hours Prior to Your Surgery.                 Do not use any chewable tobacco products for at least 6 hours prior to                 surgery.  ____  5.  Bring all medications with you on the day of surgery if instructed.   __X__  6.  Notify your doctor if there is any change in your medical condition      (cold, fever, infections).     Do not wear jewelry, make-up, hairpins, clips or nail polish. Do not wear lotions, powders, or perfumes.  Do not shave 48 hours prior to surgery. Men may shave face and neck. Do not bring valuables to the hospital.    [facility] is not responsible for any belongings or  valuables.  Contacts, dentures/partials or body piercings may not be worn into surgery. Bring a case for your contacts, glasses or hearing aids, a denture cup will be supplied. Leave your suitcase in the car. After surgery it may be brought to your room. For patients admitted to the hospital, discharge time is determined by your treatment team.   Patients discharged the day of surgery will not be allowed to drive home.   Please read over the following fact sheets that you were given:   MRSA Information  __X__ Take these medicines the morning of surgery with A SIP OF WATER:  1. none  2.   3.   4.  5.  6.  ____ Fleet Enema (as directed)   __X__ Use CHG Soap/SAGE wipes as directed  ____ Use inhalers on the day of surgery  ____ Stop metformin/Janumet/Farxiga 2 days prior to surgery    ____ Take 1/2 of usual insulin dose the night before surgery. No insulin the morning          of surgery.   ____ Stop Blood Thinners Coumadin/Plavix/Xarelto/Pleta/Pradaxa/Eliquis/Effient/Aspirin  on  Or contact your Surgeon, Cardiologist or Medical Doctor regarding  ability to stop your blood thinners  __X__ Stop Anti-inflammatories 7 days before surgery such as Advil, Ibuprofen, Motrin,  BC or Goodies Powder, Naprosyn, Naproxen, Aleve, Aspirin    __X__ Stop all herbal supplements, fish oil or vitamin E until after surgery.    ____ Bring C-Pap to the hospital.    Pre surgery carbohydrate drink 2 hours before arrival. Practice using Incentive Spirometer

## 2019-06-19 ENCOUNTER — Other Ambulatory Visit
Admission: RE | Admit: 2019-06-19 | Discharge: 2019-06-19 | Disposition: A | Discharge status: Home / Self Care | Payer: Medicare Other | Source: Ambulatory Visit | Attending: Surgery | Admitting: Surgery

## 2019-06-19 DIAGNOSIS — Z01812 Encounter for preprocedural laboratory examination: Secondary | ICD-10-CM | POA: Diagnosis present

## 2019-06-19 DIAGNOSIS — Z20822 Contact with and (suspected) exposure to covid-19: Secondary | ICD-10-CM | POA: Insufficient documentation

## 2019-06-19 LAB — SARS CORONAVIRUS 2 (TAT 6-24 HRS): SARS Coronavirus 2: NEGATIVE

## 2019-06-21 ENCOUNTER — Ambulatory Visit: Payer: Medicare Other | Admitting: Anesthesiology

## 2019-06-21 ENCOUNTER — Ambulatory Visit
Admission: RE | Admit: 2019-06-21 | Discharge: 2019-06-21 | Disposition: A | Discharge status: Home / Self Care | Payer: Medicare Other | Source: Ambulatory Visit | Attending: Surgery | Admitting: Surgery

## 2019-06-21 ENCOUNTER — Encounter: Payer: Self-pay | Admitting: Surgery

## 2019-06-21 ENCOUNTER — Other Ambulatory Visit: Payer: Self-pay

## 2019-06-21 ENCOUNTER — Ambulatory Visit: Payer: Medicare Other

## 2019-06-21 ENCOUNTER — Encounter
Admission: RE | Disposition: A | Discharge status: Home / Self Care | Payer: Self-pay | Source: Ambulatory Visit | Attending: Surgery

## 2019-06-21 DIAGNOSIS — Z820 Family history of epilepsy and other diseases of the nervous system: Secondary | ICD-10-CM | POA: Diagnosis not present

## 2019-06-21 DIAGNOSIS — Z8249 Family history of ischemic heart disease and other diseases of the circulatory system: Secondary | ICD-10-CM | POA: Diagnosis not present

## 2019-06-21 DIAGNOSIS — Z419 Encounter for procedure for purposes other than remedying health state, unspecified: Secondary | ICD-10-CM

## 2019-06-21 DIAGNOSIS — H53001 Unspecified amblyopia, right eye: Secondary | ICD-10-CM | POA: Insufficient documentation

## 2019-06-21 DIAGNOSIS — I4891 Unspecified atrial fibrillation: Secondary | ICD-10-CM | POA: Insufficient documentation

## 2019-06-21 DIAGNOSIS — Z8042 Family history of malignant neoplasm of prostate: Secondary | ICD-10-CM | POA: Diagnosis not present

## 2019-06-21 DIAGNOSIS — Z833 Family history of diabetes mellitus: Secondary | ICD-10-CM | POA: Diagnosis not present

## 2019-06-21 DIAGNOSIS — Z79899 Other long term (current) drug therapy: Secondary | ICD-10-CM | POA: Diagnosis not present

## 2019-06-21 DIAGNOSIS — Z86010 Personal history of colonic polyps: Secondary | ICD-10-CM | POA: Diagnosis not present

## 2019-06-21 DIAGNOSIS — M7581 Other shoulder lesions, right shoulder: Secondary | ICD-10-CM | POA: Insufficient documentation

## 2019-06-21 DIAGNOSIS — M75121 Complete rotator cuff tear or rupture of right shoulder, not specified as traumatic: Secondary | ICD-10-CM | POA: Diagnosis present

## 2019-06-21 HISTORY — PX: SHOULDER ARTHROSCOPY WITH ROTATOR CUFF REPAIR AND OPEN BICEPS TENODESIS: SHX6677

## 2019-06-21 SURGERY — SHOULDER ARTHROSCOPY WITH ROTATOR CUFF REPAIR AND OPEN BICEPS TENODESIS
Anesthesia: General | Site: Shoulder | Laterality: Right

## 2019-06-21 MED ORDER — OXYCODONE HCL 5 MG PO TABS
5.0000 mg | ORAL_TABLET | ORAL | Status: DC | PRN
  Filled 2019-06-21: qty 2

## 2019-06-21 MED ORDER — FENTANYL CITRATE (PF) 100 MCG/2ML IJ SOLN
INTRAMUSCULAR | Status: AC
  Administered 2019-06-21: 50 ug via INTRAVENOUS
  Filled 2019-06-21: qty 2

## 2019-06-21 MED ORDER — ACETAMINOPHEN 10 MG/ML IV SOLN
INTRAVENOUS | Status: DC | PRN
  Administered 2019-06-21: 1000 mg via INTRAVENOUS

## 2019-06-21 MED ORDER — OXYCODONE HCL 5 MG PO TABS: 5.0000 mg | ORAL_TABLET | ORAL | 0 refills | Status: DC | PRN

## 2019-06-21 MED ORDER — DEXMEDETOMIDINE HCL 200 MCG/2ML IV SOLN
INTRAVENOUS | Status: DC | PRN
  Administered 2019-06-21: 12 ug via INTRAVENOUS

## 2019-06-21 MED ORDER — ONDANSETRON HCL 4 MG/2ML IJ SOLN: 4.0000 mg | Freq: Four times a day (QID) | INTRAMUSCULAR | Status: DC | PRN

## 2019-06-21 MED ORDER — SUGAMMADEX SODIUM 200 MG/2ML IV SOLN
INTRAVENOUS | Status: DC | PRN
  Administered 2019-06-21: 200 mg via INTRAVENOUS

## 2019-06-21 MED ORDER — ROCURONIUM BROMIDE 100 MG/10ML IV SOLN
INTRAVENOUS | Status: DC | PRN
  Administered 2019-06-21: 10 mg via INTRAVENOUS
  Administered 2019-06-21: 40 mg via INTRAVENOUS

## 2019-06-21 MED ORDER — PROMETHAZINE HCL 25 MG/ML IJ SOLN: 6.2500 mg | INTRAMUSCULAR | Status: DC | PRN

## 2019-06-21 MED ORDER — CEFAZOLIN SODIUM-DEXTROSE 2-4 GM/100ML-% IV SOLN
2.0000 g | INTRAVENOUS | Status: AC
  Administered 2019-06-21: 2 g via INTRAVENOUS

## 2019-06-21 MED ORDER — PHENYLEPHRINE HCL (PRESSORS) 10 MG/ML IV SOLN
INTRAVENOUS | Status: DC | PRN
  Administered 2019-06-21 (×4): 100 ug via INTRAVENOUS

## 2019-06-21 MED ORDER — LACTATED RINGERS IV SOLN: INTRAVENOUS | Status: DC

## 2019-06-21 MED ORDER — BUPIVACAINE LIPOSOME 1.3 % IJ SUSP
INTRAMUSCULAR | Status: DC | PRN
  Administered 2019-06-21: 10 mL via PERINEURAL

## 2019-06-21 MED ORDER — MIDAZOLAM HCL 2 MG/2ML IJ SOLN
INTRAMUSCULAR | Status: AC
  Filled 2019-06-21: qty 2

## 2019-06-21 MED ORDER — BUPIVACAINE-EPINEPHRINE 0.5% -1:200000 IJ SOLN
INTRAMUSCULAR | Status: DC | PRN
  Administered 2019-06-21: 30 mL

## 2019-06-21 MED ORDER — ACETAMINOPHEN 160 MG/5ML PO SOLN
325.0000 mg | ORAL | Status: DC | PRN
  Filled 2019-06-21: qty 20.3

## 2019-06-21 MED ORDER — LIDOCAINE HCL (CARDIAC) PF 100 MG/5ML IV SOSY
PREFILLED_SYRINGE | INTRAVENOUS | Status: DC | PRN
  Administered 2019-06-21: 70 mg via INTRAVENOUS

## 2019-06-21 MED ORDER — FENTANYL CITRATE (PF) 100 MCG/2ML IJ SOLN: 25.0000 ug | INTRAMUSCULAR | Status: DC | PRN

## 2019-06-21 MED ORDER — LIDOCAINE HCL (PF) 2 % IJ SOLN
INTRAMUSCULAR | Status: AC
  Filled 2019-06-21: qty 10

## 2019-06-21 MED ORDER — MIDAZOLAM HCL 2 MG/2ML IJ SOLN: 1.0000 mg | Freq: Once | INTRAMUSCULAR | Status: AC

## 2019-06-21 MED ORDER — LACTATED RINGERS IV SOLN: INTRAVENOUS | Status: DC | PRN

## 2019-06-21 MED ORDER — METOCLOPRAMIDE HCL 5 MG/ML IJ SOLN: 5.0000 mg | Freq: Three times a day (TID) | INTRAMUSCULAR | Status: DC | PRN

## 2019-06-21 MED ORDER — CHLORHEXIDINE GLUCONATE 4 % EX LIQD: 60.0000 mL | Freq: Once | CUTANEOUS | Status: DC

## 2019-06-21 MED ORDER — DEXAMETHASONE SODIUM PHOSPHATE 10 MG/ML IJ SOLN
INTRAMUSCULAR | Status: DC | PRN
  Administered 2019-06-21: 8 mg via INTRAVENOUS

## 2019-06-21 MED ORDER — FENTANYL CITRATE (PF) 100 MCG/2ML IJ SOLN: 50.0000 ug | Freq: Once | INTRAMUSCULAR | Status: AC

## 2019-06-21 MED ORDER — HYDROCODONE-ACETAMINOPHEN 7.5-325 MG PO TABS
1.0000 | ORAL_TABLET | Freq: Once | ORAL | Status: DC | PRN
  Filled 2019-06-21: qty 1

## 2019-06-21 MED ORDER — MIDAZOLAM HCL 2 MG/2ML IJ SOLN
INTRAMUSCULAR | Status: AC
  Administered 2019-06-21: 1 mg via INTRAVENOUS
  Filled 2019-06-21: qty 2

## 2019-06-21 MED ORDER — ACETAMINOPHEN 325 MG PO TABS: 325.0000 mg | ORAL_TABLET | ORAL | Status: DC | PRN

## 2019-06-21 MED ORDER — MEPERIDINE HCL 50 MG/ML IJ SOLN: 6.2500 mg | INTRAMUSCULAR | Status: DC | PRN

## 2019-06-21 MED ORDER — MIDAZOLAM HCL 2 MG/2ML IJ SOLN
INTRAMUSCULAR | Status: DC | PRN
  Administered 2019-06-21: 2 mg via INTRAVENOUS

## 2019-06-21 MED ORDER — LIDOCAINE HCL (PF) 1 % IJ SOLN
INTRAMUSCULAR | Status: AC
  Filled 2019-06-21: qty 5

## 2019-06-21 MED ORDER — DEXAMETHASONE SODIUM PHOSPHATE 10 MG/ML IJ SOLN
INTRAMUSCULAR | Status: AC
  Filled 2019-06-21: qty 1

## 2019-06-21 MED ORDER — BUPIVACAINE LIPOSOME 1.3 % IJ SUSP
INTRAMUSCULAR | Status: AC
  Filled 2019-06-21: qty 20

## 2019-06-21 MED ORDER — ONDANSETRON HCL 4 MG PO TABS: 4.0000 mg | ORAL_TABLET | Freq: Four times a day (QID) | ORAL | Status: DC | PRN

## 2019-06-21 MED ORDER — SODIUM CHLORIDE 0.9 % IV SOLN
INTRAVENOUS | Status: DC | PRN
  Administered 2019-06-21: 11:00:00 30 ug/min via INTRAVENOUS

## 2019-06-21 MED ORDER — PROPOFOL 10 MG/ML IV BOLUS
INTRAVENOUS | Status: DC | PRN
  Administered 2019-06-21: 150 mg via INTRAVENOUS

## 2019-06-21 MED ORDER — FAMOTIDINE 20 MG PO TABS
ORAL_TABLET | ORAL | Status: AC
  Administered 2019-06-21: 20 mg via ORAL
  Filled 2019-06-21: qty 1

## 2019-06-21 MED ORDER — FENTANYL CITRATE (PF) 100 MCG/2ML IJ SOLN
INTRAMUSCULAR | Status: DC | PRN
  Administered 2019-06-21 (×2): 50 ug via INTRAVENOUS

## 2019-06-21 MED ORDER — EPHEDRINE SULFATE 50 MG/ML IJ SOLN
INTRAMUSCULAR | Status: AC
  Filled 2019-06-21: qty 1

## 2019-06-21 MED ORDER — PROPOFOL 10 MG/ML IV BOLUS
INTRAVENOUS | Status: AC
  Filled 2019-06-21: qty 20

## 2019-06-21 MED ORDER — LACTATED RINGERS IV SOLN
INTRAVENOUS | Status: DC | PRN
  Administered 2019-06-21: 1 mL

## 2019-06-21 MED ORDER — SEVOFLURANE IN SOLN
RESPIRATORY_TRACT | Status: AC
  Filled 2019-06-21: qty 250

## 2019-06-21 MED ORDER — EPHEDRINE SULFATE 50 MG/ML IJ SOLN
INTRAMUSCULAR | Status: DC | PRN
  Administered 2019-06-21: 10 mg via INTRAVENOUS
  Administered 2019-06-21: 15 mg via INTRAVENOUS

## 2019-06-21 MED ORDER — BUPIVACAINE HCL (PF) 0.5 % IJ SOLN
INTRAMUSCULAR | Status: DC | PRN
  Administered 2019-06-21: 10 mL via PERINEURAL

## 2019-06-21 MED ORDER — METOCLOPRAMIDE HCL 10 MG PO TABS: 5.0000 mg | ORAL_TABLET | Freq: Three times a day (TID) | ORAL | Status: DC | PRN

## 2019-06-21 MED ORDER — ACETAMINOPHEN 10 MG/ML IV SOLN
INTRAVENOUS | Status: AC
  Filled 2019-06-21: qty 100

## 2019-06-21 MED ORDER — BUPIVACAINE HCL (PF) 0.5 % IJ SOLN
INTRAMUSCULAR | Status: AC
  Filled 2019-06-21: qty 10

## 2019-06-21 MED ORDER — FENTANYL CITRATE (PF) 100 MCG/2ML IJ SOLN
INTRAMUSCULAR | Status: AC
  Filled 2019-06-21: qty 2

## 2019-06-21 MED ORDER — CEFAZOLIN SODIUM-DEXTROSE 2-4 GM/100ML-% IV SOLN
INTRAVENOUS | Status: AC
  Filled 2019-06-21: qty 100

## 2019-06-21 MED ORDER — FAMOTIDINE 20 MG PO TABS: 20.0000 mg | ORAL_TABLET | Freq: Once | ORAL | Status: AC

## 2019-06-21 SURGICAL SUPPLY — 53 items
ANCHOR ALL-SUT Q-FIX 2.8 (Anchor) ×12 IMPLANT
ANCHOR HEALICOIL REGEN 5.5 (Anchor) ×8 IMPLANT
ANCHOR JUGGERKNOT WTAP NDL 2.9 (Anchor) ×4 IMPLANT
BIT DRILL JUGRKNT W/NDL BIT2.9 (DRILL) ×2 IMPLANT
BLADE FULL RADIUS 3.5 (BLADE) ×4 IMPLANT
BUR ACROMIONIZER 4.0 (BURR) ×4 IMPLANT
CANNULA SHAVER 8MMX76MM (CANNULA) ×4 IMPLANT
CHLORAPREP W/TINT 26 (MISCELLANEOUS) ×4 IMPLANT
COVER MAYO STAND REUSABLE (DRAPES) ×4 IMPLANT
COVER WAND RF STERILE (DRAPES) ×4 IMPLANT
DILATOR 5.5 THREADED HEALICOIL (MISCELLANEOUS) ×4 IMPLANT
DRAPE IMP U-DRAPE 54X76 (DRAPES) ×8 IMPLANT
DRILL JUGGERKNOT W/NDL BIT 2.9 (DRILL) ×4
ELECT CAUTERY BLADE TIP 2.5 (TIP) ×4
ELECT REM PT RETURN 9FT ADLT (ELECTROSURGICAL) ×4
ELECTRODE CAUTERY BLDE TIP 2.5 (TIP) ×2 IMPLANT
ELECTRODE REM PT RTRN 9FT ADLT (ELECTROSURGICAL) ×2 IMPLANT
GAUZE SPONGE 4X4 12PLY STRL (GAUZE/BANDAGES/DRESSINGS) ×4 IMPLANT
GAUZE XEROFORM 1X8 LF (GAUZE/BANDAGES/DRESSINGS) ×4 IMPLANT
GLOVE BIO SURGEON STRL SZ7.5 (GLOVE) ×8 IMPLANT
GLOVE BIO SURGEON STRL SZ8 (GLOVE) ×8 IMPLANT
GLOVE BIOGEL PI IND STRL 8 (GLOVE) ×2 IMPLANT
GLOVE BIOGEL PI INDICATOR 8 (GLOVE) ×2
GLOVE INDICATOR 8.0 STRL GRN (GLOVE) ×4 IMPLANT
GOWN STRL REUS W/ TWL LRG LVL3 (GOWN DISPOSABLE) ×2 IMPLANT
GOWN STRL REUS W/ TWL XL LVL3 (GOWN DISPOSABLE) ×2 IMPLANT
GOWN STRL REUS W/TWL LRG LVL3 (GOWN DISPOSABLE) ×2
GOWN STRL REUS W/TWL XL LVL3 (GOWN DISPOSABLE) ×2
GRASPER SUT 15 45D LOW PRO (SUTURE) IMPLANT
IV LACTATED RINGER IRRG 3000ML (IV SOLUTION) ×2
IV LR IRRIG 3000ML ARTHROMATIC (IV SOLUTION) ×2 IMPLANT
KIT CANNULA 8X76-LX IN CANNULA (CANNULA) IMPLANT
KIT SUTURE 2.8 Q-FIX DISP (MISCELLANEOUS) ×4 IMPLANT
MANIFOLD NEPTUNE II (INSTRUMENTS) ×4 IMPLANT
MASK FACE SPIDER DISP (MASK) ×4 IMPLANT
MAT ABSORB  FLUID 56X50 GRAY (MISCELLANEOUS) ×2
MAT ABSORB FLUID 56X50 GRAY (MISCELLANEOUS) ×2 IMPLANT
PACK ARTHROSCOPY SHOULDER (MISCELLANEOUS) ×4 IMPLANT
PASSER SUT FIRSTPASS SELF (INSTRUMENTS) ×4 IMPLANT
SLING ARM LRG DEEP (SOFTGOODS) IMPLANT
SLING ULTRA II LG (MISCELLANEOUS) ×4 IMPLANT
STAPLER SKIN PROX 35W (STAPLE) ×4 IMPLANT
STRAP SAFETY 5IN WIDE (MISCELLANEOUS) ×4 IMPLANT
SUT ETHIBOND 0 MO6 C/R (SUTURE) ×4 IMPLANT
SUT ULTRABRAID 2 COBRAID 38 (SUTURE) ×16 IMPLANT
SUT VIC AB 2-0 CT1 27 (SUTURE) ×4
SUT VIC AB 2-0 CT1 TAPERPNT 27 (SUTURE) ×4 IMPLANT
TAPE MICROFOAM 4IN (TAPE) ×4 IMPLANT
TISSUE ARTHROFLEX DERMUS 35X35 (Tissue) ×4 IMPLANT
TUBING ARTHRO INFLOW-ONLY STRL (TUBING) ×4 IMPLANT
TUBING CONNECTING 10 (TUBING) ×3 IMPLANT
TUBING CONNECTING 10' (TUBING) ×1
WAND WEREWOLF FLOW 90D (MISCELLANEOUS) ×4 IMPLANT

## 2019-06-21 NOTE — Op Note (Signed)
06/21/2019  12:55 PM  Patient:   Raymond Vazquez.  Pre-Op Diagnosis:   Massive nontraumatic full-thickness rotator cuff tear, right shoulder.  Post-Op Diagnosis:   Massive nontraumatic full-thickness rotator cuff tear and biceps tendinopathy, right shoulder.  Procedure:   Limited arthroscopic debridement, arthroscopic subacromial decompression, mini-open rotator cuff repair using a dermal allograft, and mini-open biceps tenodesis, right shoulder.  Anesthesia:   General endotracheal with interscalene block using Exparel placed preoperatively by the anesthesiologist.  Surgeon:   Pascal Lux, MD  Assistant:   Cameron Proud, PA-C; Cherlynn June, PA-S  Findings:   As above.  The rotator cuff tear involve the entire supraspinatus and most if not all of the infraspinatus tendons and was retracted back to the glenoid.  The biceps tendon demonstrated evidence of "lip sticking" without any partial or full-thickness tears.  The labrum was in satisfactory condition other than minor degenerative fraying anteriorly and superiorly without frank detachment from the glenoid.  The articular surfaces of the glenoid and humerus both were in satisfactory condition.  Complications:   None  Fluids:   1000 cc  Estimated blood loss:   50 cc  Tourniquet time:   None  Drains:   None  Closure:   Staples      Brief clinical note:   The patient is a 74 year old male with a history of progressively worsening right shoulder pain. The patient's symptoms have progressed despite medications, activity modification, etc. The patient's history and examination are consistent with impingement/tendinopathy with a rotator cuff tear. These findings were confirmed by MRI scan. The patient presents at this time for definitive management of his shoulder symptoms.  Procedure:   The patient underwent placement of an interscalene block using Exparel by the anesthesiologist in the preoperative holding area before being  brought into the operating room and lain in the supine position. The patient then underwent general endotracheal intubation and anesthesia before being repositioned in the beach chair position using the beach chair positioner. The right shoulder and upper extremity were prepped with ChloraPrep solution before being draped sterilely. Preoperative antibiotics were administered. A timeout was performed to confirm the proper surgical site before the expected portal sites and incision site were injected with 0.5% Sensorcaine with epinephrine. A posterior portal was created and the glenohumeral joint thoroughly inspected with the findings as described above. An anterior portal was created using an outside-in technique. The labrum and rotator cuff were further probed, again confirming the above-noted findings. The areas of labral fraying were debrided back to stable margins using the full-radius resector, as were the frayed margins of the rotator cuff. The ArthroCare wand was inserted and used to release the biceps tendon from its labral anchor. It also was used to obtain hemostasis as well as to "anneal" the labrum superiorly and anteriorly. The instruments were removed from the joint after suctioning the excess fluid.  The camera was repositioned through the posterior portal into the subacromial space. A separate lateral portal was created using an outside-in technique. The 3.5 mm full-radius resector was introduced and used to perform a subtotal bursectomy. The ArthroCare wand was then inserted and used to remove the periosteal tissue off the undersurface of the anterior third of the acromion as well as to recess the coracoacromial ligament from its attachment along the anterior and lateral margins of the acromion. The 4.0 mm acromionizing bur was introduced and used to complete the decompression by removing the undersurface of the anterior third of the acromion. The full  radius resector was reintroduced to remove any  residual bony debris before the ArthroCare wand was reintroduced to obtain hemostasis. The instruments were then removed from the subacromial space after suctioning the excess fluid.  An approximately 4-5 cm incision was made over the anterolateral aspect of the shoulder beginning at the anterolateral corner of the acromion and extending distally in line with the bicipital groove. This incision was carried down through the subcutaneous tissues to expose the deltoid fascia. The raphae between the anterior and middle thirds was identified and this plane developed to provide access into the subacromial space. Additional bursal tissues were debrided sharply using Metzenbaum scissors. The rotator cuff tear was readily identified. The margins were debrided sharply with a #15 blade and the exposed greater tuberosity roughened with a rongeur. Several tagging sutures were placed in the margin of the tear and it was advanced laterally. Despite extensive superficial and deep soft tissue releases, the tendon could only be brought to within 6 to 8 mm of the lateral articular margin. Therefore it was elected to augment this repair with a bridging dermal allograft patch.   Numerous (5) #2 MaxBraid sutures were placed in a horizontal mattress fashion through the torn margin of the rotator cuff.  The human dermal allograft patch was trimmed to the appropriate size before each of the sutures were placed through the graft using the [employer] device.  Once all of the sutures were placed, the graft was advanced over the defect and the sutures tied securely.  Laterally, the tear was repaired using three [employer] 2.8 mm Q-Fix anchors. These sutures were then brought back laterally and secured using two Malin knotless Regenasorb anchors to create a two-layer closure. Posteriorly, several #0 Ethibond interrupted sutures were used to secure the posterior aspect of the graft to the posterior portion  of the rotator cuff. An apparent watertight closure was obtained.  The bicipital groove was identified by palpation and opened for 1-1.5 cm. The biceps tendon stump was retrieved through this defect. The floor of the bicipital groove was roughened with a curet before a single Biomet 2.9 mm JuggerKnot anchor was inserted. Both sets of sutures were passed through the biceps tendon and tied securely to effect the tenodesis. The bicipital sheath was reapproximated using two #0 Ethibond interrupted sutures, incorporating the biceps tendon to further reinforce the tenodesis.  The wound was copiously irrigated with sterile saline solution before the deltoid raphae was reapproximated using 2-0 Vicryl interrupted sutures. The subcutaneous tissues were closed in two layers using 2-0 Vicryl interrupted sutures before the skin was closed using staples. The portal sites also were closed using staples. A sterile bulky dressing was applied to the shoulder before the arm was placed into a shoulder immobilizer. The patient was then awakened, extubated, and returned to the recovery room in satisfactory condition after tolerating the procedure well.

## 2019-06-21 NOTE — Discharge Instructions (Addendum)
Interscalene Nerve Block (ISNB) Discharge Instructions   1.  For your surgery you have received an Interscalene Nerve Block. 2. Nerve Blocks affect many types of nerves, including nerves that control movement, pain and normal sensation.  You may experience feelings such as numbness, tingling, heaviness, weakness or the inability to move your arm or the feeling or sensation that your arm has "fallen asleep". 3. A nerve block can last for 2 - 36 hours or more depending on the medication used.  Usually the weakness wears off first.  The tingling and heaviness usually wear off next.  Finally you may start to notice pain.  Keep in mind that this may occur in any order.  once a nerve block starts to wear off it is usually completely gone within 60 minutes. 4. ISNB may cause mild shortness of breath, a hoarse voice, blurry vision, unequal pupils, or drooping of the face on the same side as the nerve block.  These symptoms will usually go away within 12 hours.  Very rarely the procedure itself can cause mild seizures. 5. If needed, your surgeon will give you a prescription for pain medication.  It will take about 60 minutes for the oral pain medication to become fully effective.  So, it is recommended that you start taking this medication before the nerve block first begins to wear off, or when you first begin to feel discomfort. 6. Keep in mind that nerve blocks often wear off in the middle of the night.   If you are going to bed and the block has not started to wear off or you have not started to have any discomfort, consider setting an alarm for 2 to 3 hours, so you can assess your block.  If you notice the block is wearing off or you are starting to have discomfort, you can take your pain medication. 7. Take your pain medication only as prescribed.  Pain medication can cause sedation and decrease your breathing if you take more than you need for the level of pain that you have. 8. Nausea is a common side effect  of many pain medications.  You may want to eat something before taking your pain medicine to prevent nausea. 9. After an Interscalene nerve block, you cannot feel pain, pressure or extremes in temperature in the effected arm.  Because your arm is numb it is at an increased risk for injury.  To decrease the possibility of injury, please practice the following:  a. While you are awake change the position of your arm frequently to prevent too much pressure on any one area for prolonged periods of time. b.  If you have a cast or tight dressing, check the color or your fingers every couple of hours.  Call your surgeon with the appearance of any discoloration (white or blue). c. If you are given a sling to wear before you go home, please wear it  at all times until the block has completely worn off.  Do not get up at night without your sling. d. If you experience any problems or concerns, please contact your surgeon's office. If you experience severe or prolonged shortness of breath go to the nearest emergency department.   Orthopedic discharge instructions: Keep dressing dry and intact.  May shower after dressing changed on post-op day #4 (Monday).  Cover staples/sutures with Band-Aids after drying off. Apply ice frequently to shoulder. Take Aleve 2 tablets BID with meals for 7-10 days, then as necessary. Take oxycodone as prescribed  when needed.  May supplement with ES Tylenol if necessary. May resume Eliquis tomorrow morning, Friday, 06/22/2019. Keep shoulder immobilizer on at all times except may remove for bathing purposes. Follow-up in 10-14 days or as scheduled.  AMBULATORY SURGERY  DISCHARGE INSTRUCTIONS   1) The drugs that you were given will stay in your system until tomorrow so for the next 24 hours you should not:  A) Drive an automobile B) Make any legal decisions C) Drink any alcoholic beverage   2) You may resume regular meals tomorrow.  Today it is better to start with  liquids and gradually work up to solid foods.  You may eat anything you prefer, but it is better to start with liquids, then soup and crackers, and gradually work up to solid foods.   3) Please notify your doctor immediately if you have any unusual bleeding, trouble breathing, redness and pain at the surgery site, drainage, fever, or pain not relieved by medication.    4) Additional Instructions:        Please contact your physician with any problems or Same Day Surgery at [phone], Monday through Friday 6 am to 4 pm, or [place] at [facility] number at [phone].

## 2019-06-21 NOTE — H&P (Signed)
Paper H&P to be scanned into permanent record. H&P reviewed and patient re-examined. No changes. 

## 2019-06-21 NOTE — Anesthesia Procedure Notes (Signed)
Procedure Name: Intubation Date/Time: 06/21/2019 10:41 AM Performed by: Justus Memory, CRNA Pre-anesthesia Checklist: Patient identified, Patient being monitored, Timeout performed, Emergency Drugs available and Suction available Patient Re-evaluated:Patient Re-evaluated prior to induction Oxygen Delivery Method: Circle system utilized Preoxygenation: Pre-oxygenation with 100% oxygen Induction Type: IV induction Ventilation: Mask ventilation without difficulty Laryngoscope Size: 3 and McGraph Grade View: Grade I Tube type: Oral Tube size: 7.0 mm Number of attempts: 1 Airway Equipment and Method: Stylet Placement Confirmation: ETT inserted through vocal cords under direct vision,  positive ETCO2 and breath sounds checked- equal and bilateral Secured at: 21 cm Tube secured with: Tape Dental Injury: Teeth and Oropharynx as per pre-operative assessment

## 2019-06-21 NOTE — Transfer of Care (Signed)
Immediate Anesthesia Transfer of Care Note  Patient: Raymond Vazquez.  Procedure(s) Performed: SHOULDER ARTHROSCOPY DEBRIDEMENT, DECOMPRESSION, AND REPAIR OF MASSIVE  ROTATOR CUFF TEAR WITH BICEPS TENODESIS. (Right Shoulder)  Patient Location: PACU  Anesthesia Type:General  Level of Consciousness: sedated  Airway & Oxygen Therapy: Patient Spontanous Breathing and Patient connected to face mask oxygen  Post-op Assessment: Report given to RN and Post -op Vital signs reviewed and stable  Post vital signs: Reviewed and stable  Last Vitals:  Vitals Value Taken Time  BP 127/68 06/21/19 1324  Temp 35.7 C 06/21/19 1324  Pulse 74 06/21/19 1328  Resp 16 06/21/19 1328  SpO2 96 % 06/21/19 1328  Vitals shown include unvalidated device data.  Last Pain:  Vitals:   06/21/19 1324  TempSrc:   PainSc: Asleep         Complications: No apparent anesthesia complications

## 2019-06-21 NOTE — Anesthesia Procedure Notes (Addendum)
Anesthesia Regional Block: Interscalene brachial plexus block   Pre-Anesthetic Checklist: ,, timeout performed, Correct Patient, Correct Site, Correct Laterality, Correct Procedure, Correct Position, site marked, Risks and benefits discussed,  Surgical consent,  Pre-op evaluation,  At surgeon's request and post-op pain management  Laterality: Upper and Right  Prep: chloraprep       Needles:  Injection technique: Single-shot  Needle Type: Echogenic Stimulator Needle     Needle Length: 10cm  Needle Gauge: 20   Needle insertion depth: 5 cm   Additional Needles:   Procedures: Doppler guided,,,, ultrasound used (permanent image in chart),,,,  Motor weakness within 6 minutes.  Narrative:  Start time: 06/21/2019 9:20 AM End time: 06/21/2019 9:28 AM Injection made incrementally with aspirations every 5 mL.  Performed by: Personally  Anesthesiologist: Alphonsus Sias, MD  Additional Notes: Functioning IV was confirmed and O2 [place]/monitors were applied. Light sedation administered as required, patient responsive throughout. A 165mm 20ga EchoStim needle was used. Sterile prep and drape,hand hygiene and sterile gloves were used.  Negative aspiration and negative test dose prior to incremental administration of local anesthetic. 1% Lidocaine for skin wheal, 4 ml. Total LA: 37ml - Exparel 71ml & 0.5% Bupivicaine 69ml. U/S images stored in chart. The patient tolerated the procedure well.

## 2019-06-21 NOTE — Anesthesia Preprocedure Evaluation (Addendum)
Anesthesia Evaluation  Patient identified by MRN, date of birth, ID band Patient awake    Reviewed: Allergy & Precautions, H&P , NPO status , reviewed documented beta blocker date and time   History of Anesthesia Complications (+) history of anesthetic complications  Airway Mallampati: I  TM Distance: <3 FB Neck ROM: full    Dental  (+) Teeth Intact   Pulmonary    Pulmonary exam normal        Cardiovascular Normal cardiovascular exam Rhythm:regular  Sinus rhythm on EKG   03/2019 ECHO IMPRESSIONS    1. Left ventricular ejection fraction, by visual estimation, is 50 to 55%. The left ventricle has normal function. There is no left ventricular hypertrophy.  2. Global right ventricle has normal systolic function.The right ventricular size is normal. No increase in right ventricular wall thickness.  3. Left atrial size was normal.  4. Right atrial size was normal.  5. The mitral valve is normal in structure. Trace mitral valve regurgitation.  6. The tricuspid valve is normal in structure. Tricuspid valve regurgitation is trivial.  7. The aortic valve is normal in structure. Aortic valve regurgitation was not visualized by color flow Doppler.  8. The pulmonic valve was normal in structure. Pulmonic valve regurgitation is not visualized by color flow Doppler   Neuro/Psych    GI/Hepatic neg GERD  ,  Endo/Other    Renal/GU      Musculoskeletal   Abdominal   Peds  Hematology   Anesthesia Other Findings Past Medical History: No date: A-fib [facility]) Now in SR No date: Amblyopia, right eye No date: Complication of anesthesia     Comment:  episode of bigeminy during colonoscopy No date: Hypertrophy (benign) of prostate No date: Intussusception [facility]) Past Surgical History: No date: COLON SURGERY 09/05/2015: COLONOSCOPY WITH PROPOFOL; N/A     Comment:  Procedure: COLONOSCOPY WITH PROPOFOL;  Surgeon: Manya Silvas, MD;  Location: [place];  Service:               Endoscopy;  Laterality: N/A; 01/26/2019: COLONOSCOPY WITH PROPOFOL; N/A     Comment:  Procedure: COLONOSCOPY WITH PROPOFOL;  Surgeon:               Lollie Sails, MD;  Location: [facility] ENDOSCOPY;                Service: Endoscopy;  Laterality: N/A; No date: SMALL BOWEL REPAIR No date: WISDOM TOOTH EXTRACTION   Reproductive/Obstetrics                          Anesthesia Physical Anesthesia Plan  ASA: II  Anesthesia Plan: General   Post-op Pain Management:  Regional for Post-op pain   Induction: Intravenous  PONV Risk Score and Plan: Ondansetron and Treatment may vary due to age or medical condition  Airway Management Planned: Oral ETT  Additional Equipment:   Intra-op Plan:   Post-operative Plan: Extubation in OR  Informed Consent: I have reviewed the patients History and Physical, chart, labs and discussed the procedure including the risks, benefits and alternatives for the proposed anesthesia with the patient or authorized representative who has indicated his/her understanding and acceptance.     Dental Advisory Given  Plan Discussed with: CRNA  Anesthesia Plan Comments:        Anesthesia Quick Evaluation

## 2019-06-28 NOTE — Anesthesia Postprocedure Evaluation (Signed)
Anesthesia Post Note  Patient: Jaydeen Corcoran.  Procedure(s) Performed: SHOULDER ARTHROSCOPY DEBRIDEMENT, DECOMPRESSION, AND REPAIR OF MASSIVE  ROTATOR CUFF TEAR WITH BICEPS TENODESIS. (Right Shoulder) (RADIOFREQUENCY) ABLATION  Patient location during evaluation: PACU Anesthesia Type: General Level of consciousness: awake and alert Pain management: pain level controlled (Good block functn) Vital Signs Assessment: post-procedure vital signs reviewed and stable Respiratory status: spontaneous breathing, nonlabored ventilation and respiratory function stable Cardiovascular status: blood pressure returned to baseline and stable Postop Assessment: no apparent nausea or vomiting Anesthetic complications: no     Last Vitals:  Vitals:   06/21/19 1518 06/21/19 1550  BP: 122/71 123/67  Pulse: 68 68  Resp: 16 16  Temp: 36.5 C   SpO2: 97% 99%    Last Pain:  Vitals:   06/22/19 0854  TempSrc:   PainSc: 0-No pain                 Alphonsus Sias

## 2020-03-11 ENCOUNTER — Other Ambulatory Visit: Payer: Self-pay | Admitting: Surgery

## 2020-03-11 DIAGNOSIS — M222X1 Patellofemoral disorders, right knee: Secondary | ICD-10-CM

## 2020-03-11 DIAGNOSIS — M1711 Unilateral primary osteoarthritis, right knee: Secondary | ICD-10-CM

## 2020-03-24 ENCOUNTER — Ambulatory Visit
Admission: RE | Admit: 2020-03-24 | Discharge: 2020-03-24 | Disposition: A | Discharge status: Home / Self Care | Payer: Medicare Other | Source: Ambulatory Visit | Attending: Surgery | Admitting: Surgery

## 2020-03-24 ENCOUNTER — Other Ambulatory Visit: Payer: Self-pay

## 2020-03-24 DIAGNOSIS — M222X1 Patellofemoral disorders, right knee: Secondary | ICD-10-CM

## 2020-03-24 DIAGNOSIS — M1711 Unilateral primary osteoarthritis, right knee: Secondary | ICD-10-CM | POA: Insufficient documentation

## 2020-06-02 ENCOUNTER — Other Ambulatory Visit: Payer: Self-pay | Admitting: Surgery

## 2020-06-11 ENCOUNTER — Encounter
Admission: RE | Admit: 2020-06-11 | Discharge: 2020-06-11 | Disposition: A | Discharge status: Home / Self Care | Payer: Medicare Other | Source: Ambulatory Visit | Attending: Surgery | Admitting: Surgery

## 2020-06-11 ENCOUNTER — Other Ambulatory Visit: Payer: Self-pay

## 2020-06-11 HISTORY — DX: Pneumonia, unspecified organism: J18.9

## 2020-06-11 HISTORY — DX: Personal history of colon polyps: Z86.010

## 2020-06-11 HISTORY — DX: Personal history of colon polyps, unspecified: Z86.0100

## 2020-06-11 NOTE — Patient Instructions (Addendum)
Your procedure is scheduled on: Wednesday, January 12 Report to the Registration Desk on the 1st floor of the [facility] To find out your arrival time, please call [phone] between 1PM - 3PM on: Tuesday, January 11  REMEMBER: Instructions that are not followed completely may result in serious medical risk, up to and including death; or upon the discretion of your surgeon and anesthesiologist your surgery may need to be rescheduled.  Do not eat food after midnight the night before surgery.  No gum chewing, lozengers or hard candies.  You may however, drink CLEAR liquids up to 2 hours before you are scheduled to arrive for your surgery. Do not drink anything within 2 hours of your scheduled arrival time.  Clear liquids include: - water  - apple juice without pulp - gatorade (not RED, PURPLE, OR BLUE) - black coffee or tea (Do NOT add milk or creamers to the coffee or tea) Do NOT drink anything that is not on this list.  In addition, your doctor has ordered for you to drink the provided  Ensure Pre-Surgery Clear Carbohydrate Drink  Drinking this carbohydrate drink up to two hours before surgery helps to reduce insulin resistance and improve patient outcomes. Please complete drinking 2 hours prior to scheduled arrival time.  DO NOT TAKE ANY MEDICATIONS THE MORNING OF SURGERY   One week prior to surgery: starting January 5 Stop Anti-inflammatories (NSAIDS) such as Advil, Aleve, Ibuprofen, Motrin, Naproxen, Naprosyn and Aspirin based products such as Excedrin, Goodys Powder, BC Powder. Stop ANY OVER THE COUNTER supplements until after surgery. (However, you may continue taking Vitamin B, and multivitamin up until the day before surgery.)  No Alcohol for 24 hours before or after surgery.  On the morning of surgery brush your teeth with toothpaste and water, you may rinse your mouth with mouthwash if you wish. Do not swallow any toothpaste or mouthwash.  Do not wear jewelry,  make-up, hairpins, clips or nail polish.  Do not wear lotions, powders, or perfumes.   Do not shave body from the neck down 48 hours prior to surgery just in case you cut yourself which could leave a site for infection.  Also, freshly shaved skin may become irritated if using the CHG soap.  Contact lenses, hearing aids and dentures may not be worn into surgery.  Do not bring valuables to the hospital. [facility] is not responsible for any missing/lost belongings or valuables.   Use CHG Soap as directed on instruction sheet.  Notify your doctor if there is any change in your medical condition (cold, fever, infection).  Wear comfortable clothing (specific to your surgery type) to the hospital.  Plan for stool softeners for home use; pain medications have a tendency to cause constipation. You can also help prevent constipation by eating foods high in fiber such as fruits and vegetables and drinking plenty of fluids as your diet allows.  After surgery, you can help prevent lung complications by doing breathing exercises.  Take deep breaths and cough every 1-2 hours. Your doctor may order a device called an Incentive Spirometer to help you take deep breaths.  If you are being discharged the day of surgery, you will not be allowed to drive home. You will need a responsible adult (18 years or older) to drive you home and stay with you that night.   If you are taking public transportation, you will need to have a responsible adult (18 years or older) with you. Please confirm with your  physician that it is acceptable to use public transportation.   Please call the [place] at [phone] if you have any questions about these instructions.  Visitation Policy:  Patients undergoing a surgery or procedure may have one family member or support person with them as long as that person is not COVID-19 positive or experiencing its symptoms.  That person may remain in the  waiting area during the procedure.

## 2020-06-12 ENCOUNTER — Encounter
Admission: RE | Admit: 2020-06-12 | Discharge: 2020-06-12 | Disposition: A | Discharge status: Home / Self Care | Payer: Medicare Other | Source: Ambulatory Visit | Attending: Surgery | Admitting: Surgery

## 2020-06-12 DIAGNOSIS — Z01818 Encounter for other preprocedural examination: Secondary | ICD-10-CM | POA: Diagnosis present

## 2020-06-12 DIAGNOSIS — I4891 Unspecified atrial fibrillation: Secondary | ICD-10-CM | POA: Diagnosis not present

## 2020-06-12 LAB — CBC
HCT: 44.7 % (ref 39.0–52.0)
Hemoglobin: 14.9 g/dL (ref 13.0–17.0)
MCH: 28.7 pg (ref 26.0–34.0)
MCHC: 33.3 g/dL (ref 30.0–36.0)
MCV: 86 fL (ref 80.0–100.0)
Platelets: 193 10*3/uL (ref 150–400)
RBC: 5.2 MIL/uL (ref 4.22–5.81)
RDW: 12.3 % (ref 11.5–15.5)
WBC: 7.9 10*3/uL (ref 4.0–10.5)
nRBC: 0 % (ref 0.0–0.2)

## 2020-06-12 LAB — BASIC METABOLIC PANEL
Anion gap: 11 (ref 5–15)
BUN: 19 mg/dL (ref 8–23)
CO2: 26 mmol/L (ref 22–32)
Calcium: 9.1 mg/dL (ref 8.9–10.3)
Chloride: 100 mmol/L (ref 98–111)
Creatinine, Ser: 0.66 mg/dL (ref 0.61–1.24)
GFR, Estimated: >60 mL/min (ref >60)
Glucose, Bld: 91 mg/dL (ref 70–99)
Potassium: 3.7 mmol/L (ref 3.5–5.1)
Sodium: 137 mmol/L (ref 135–145)

## 2020-06-13 NOTE — Progress Notes (Signed)
[facility] Perioperative Services  Pre-Admission/Anesthesia Testing Clinical Review  Date: 06/13/20  Patient Demographics:  Name: Raymond Vazquez. DOB:   1946-04-10 MRN:   [id number]  Planned Surgical Procedure(s):    Case: [id number] Date/Time: 06/18/20 0945   Procedure: RIGHT KNEE ARTHROSCOPY WITH DEBRIDEMENT AND REPAIR VERSUS PARTIAL LATERAL MENISCECTOMY. (Right Knee)   Anesthesia type: Choice   Pre-op diagnosis:      Complex tear of lateral meniscus of right knee as current injury, subsequent encounter S83.271D     Primary osteoarthritis of right knee M17.11   Location: [facility] OR ROOM 02 / [facility]   Surgeons: Corky Mull, MD    NOTE: Available PAT nursing documentation and vital signs have been reviewed. Clinical nursing staff has updated patient's PMH/PSHx, current medication list, and drug allergies/intolerances to ensure comprehensive history available to assist in medical decision making as it pertains to the aforementioned surgical procedure and anticipated anesthetic course.   Clinical Discussion:  Raymond Vazquez. is a 75 y.o. male who is submitted for pre-surgical anesthesia review and clearance prior to him undergoing the above procedure. Patient has never been a smoker. Pertinent PMH includes: atrial fibrillation, BPH  Patient is followed by cardiology Nehemiah Massed, MD). He was last seen in the cardiology clinic on 12/03/2019; notes reviewed.  At the time of his clinic visit, patient doing well overall from a cardiovascular perspective.  Patient with episodes of palpitations and vertiginous symptoms.  PMH (+) paroxysmal atrial fibrillation. CHA2DS2-VASc Score = 1.  The patient is not on daily anticoagulation/antiplatelet therapy.  Last TTE performed on 03/30/2019 revealed normal left ventricular systolic function with an EF of 50-55% (see full interpretation of cardiovascular testing below).  Blood pressure well controlled  at 120/70; takes no medications.  Given the intermittent nature of his presenting symptoms, the decision was made to defer any further cardiovascular diagnostic testing at that time.  No changes were made to patient's medication regimen.  Patient follow-up with outpatient cardiology in 1 year or sooner if needed.  Patient is scheduled to undergo an elective orthopedic procedure on 06/18/2020 with Dr. Milagros Evener.  Given patient's past medical history significant for atrial fibrillation, presurgical cardiac clearance sought by PAT team.  Per cardiology, "this patient is optimized for surgery and may proceed with the planned procedural course with a LOW risk stratification". Again, this patient is not on daily anticoagulation or antiplatelet therapy.  He reports previous perioperative complications with anesthesia.  Patient making mention of brief episode of bigeminy experienced during a routine colonoscopy; no intervention required. He underwent a general anesthetic course here (ASA II) in 06/2019 with no documented complications.   Vitals with BMI 06/11/2020 06/21/2019 06/21/2019  Height 5\' 8"  - -  Weight 158 lbs - -  BMI 14.43 - -  Systolic - 154 008  Diastolic - 67 71  Pulse - 68 68    Providers/Specialists:   NOTE: Primary physician provider listed below. Patient may have been seen by APP or partner within same practice.   PROVIDER ROLE / SPECIALTY LAST OV  Poggi, Marshall Cork, MD Orthopedics (Surgeon)  04/14/2020  Kirk Ruths, MD Primary Care Provider  02/13/2020  Serafina Royals, MD  Cardiology  12/03/2019   Allergies:  Patient has no known allergies.  Current Home Medications:   No current facility-administered medications for this encounter.   . cyanocobalamin 1000 MCG tablet  . dutasteride (AVODART) 0.5 MG capsule  . Multiple Vitamin (MULTIVITAMIN) tablet  .  tamsulosin (FLOMAX) 0.4 MG CAPS capsule   History:   Past Medical History:  Diagnosis Date  . A-fib ([place])   .  Amblyopia, right eye   . Complication of anesthesia    episode of bigeminy during colonoscopy  . History of colon polyps   . Hypertrophy (benign) of prostate   . Intussusception ([place])   . Pneumonia    Past Surgical History:  Procedure Laterality Date  . COLON SURGERY    . COLONOSCOPY  2006, 2008, 2011, 2012  . COLONOSCOPY WITH PROPOFOL N/A 09/05/2015   Procedure: COLONOSCOPY WITH PROPOFOL;  Surgeon: Manya Silvas, MD;  Location: [facility] ENDOSCOPY;  Service: Endoscopy;  Laterality: N/A;  . COLONOSCOPY WITH PROPOFOL N/A 01/26/2019   Procedure: COLONOSCOPY WITH PROPOFOL;  Surgeon: Lollie Sails, MD;  Location: [facility] ENDOSCOPY;  Service: Endoscopy;  Laterality: N/A;  . SHOULDER ARTHROSCOPY WITH ROTATOR CUFF REPAIR AND OPEN BICEPS TENODESIS Right 06/21/2019   Procedure: SHOULDER ARTHROSCOPY DEBRIDEMENT, DECOMPRESSION, AND REPAIR OF MASSIVE  ROTATOR CUFF TEAR WITH BICEPS TENODESIS.;  Surgeon: Corky Mull, MD;  Location: [facility] ORS;  Service: Orthopedics;  Laterality: Right;  . SMALL BOWEL REPAIR  age 83  . WISDOM TOOTH EXTRACTION     Family History  Problem Relation Age of Onset  . Diabetes Mother   . Prostate cancer Father   . Alzheimer's disease Father    Social History   Tobacco Use  . Smoking status: Never Smoker  . Smokeless tobacco: Never Used  Vaping Use  . Vaping Use: Never used  Substance Use Topics  . Alcohol use: No  . Drug use: Never    Pertinent Clinical Results:  LABS: Labs reviewed: Acceptable for surgery.  Hospital Outpatient Visit on 06/12/2020  Component Date Value Ref Range Status  . Sodium 06/12/2020 137  135 - 145 mmol/L Final  . Potassium 06/12/2020 3.7  3.5 - 5.1 mmol/L Final  . Chloride 06/12/2020 100  98 - 111 mmol/L Final  . CO2 06/12/2020 26  22 - 32 mmol/L Final  . Glucose, Bld 06/12/2020 91  70 - 99 mg/dL Final   Glucose reference range applies only to samples taken after fasting for at least 8 hours.  . BUN 06/12/2020 19  8 - 23 mg/dL Final   . Creatinine, Ser 06/12/2020 0.66  0.61 - 1.24 mg/dL Final  . Calcium 06/12/2020 9.1  8.9 - 10.3 mg/dL Final  . GFR, Estimated 06/12/2020 >60  >60 mL/min Final   Comment: (NOTE) Calculated using the CKD-EPI Creatinine Equation (2021)   . Anion gap 06/12/2020 11  5 - 15 Final   Performed at [facility], [address] [place], [place] [address]  . WBC 06/12/2020 7.9  4.0 - 10.5 K/uL Final  . RBC 06/12/2020 5.20  4.22 - 5.81 MIL/uL Final  . Hemoglobin 06/12/2020 14.9  13.0 - 17.0 g/dL Final  . HCT 06/12/2020 44.7  39.0 - 52.0 % Final  . MCV 06/12/2020 86.0  80.0 - 100.0 fL Final  . MCH 06/12/2020 28.7  26.0 - 34.0 pg Final  . MCHC 06/12/2020 33.3  30.0 - 36.0 g/dL Final  . RDW 06/12/2020 12.3  11.5 - 15.5 % Final  . Platelets 06/12/2020 193  150 - 400 K/uL Final  . nRBC 06/12/2020 0.0  0.0 - 0.2 % Final   Performed at [facility], [address] [place], [place] [address]    ECG: Date: 06/12/2020 Time ECG obtained: 1332 PM Rate: 63 bpm Rhythm: Sinus  rhythm with occasional atrial and ventricular ectopy Axis (leads I and aVF): Normal Intervals: PR 186 ms. QRS 90 ms. QTc 440 ms. ST segment and T wave changes: No evidence of acute ST segment elevation or depression Comparison: When compared to tracing from 03/29/2019, there is atrial and ventricular ectopy noted.   IMAGING / PROCEDURES: MRI KNEE RIGHT W0 CONTRAST performed on 03/24/2020 1. Horizontal tear anterior horn and anterior body of the lateral meniscus 2. Mild to moderate osteoarthritis is most notable in the lateral compartment 3. Small, multiseptated cyst deep to the distal biceps femoris tendon is most consistent with a ganglion.  ECHOCARDIOGRAM performed on 03/30/2019 1. Left ventricular ejection fraction, by visual estimation, is 50 to 55%.  2. The left ventricle has normal function.  3. There is no left ventricular hypertrophy.  4. Global right ventricle has normal systolic function. 5. The  right ventricular size is normal.  6. No increase in right ventricular wall thickness.  7. Left atrial size was normal.  8. Right atrial size was normal.  9. The mitral valve is normal in structure. Trace mitral valve regurgitation.  10. The tricuspid valve is normal in structure. Tricuspid valve regurgitation is trivial.  11. The aortic valve is normal in structure. Aortic valve regurgitation was not visualized by color flow Doppler.  12. The pulmonic valve was normal in structure. Pulmonic valve regurgitation is not visualized by color flow Doppler.   Impression and Plan:  Raymond Vazquez. has been referred for pre-anesthesia review and clearance prior to him undergoing the planned anesthetic and procedural courses. Available labs, pertinent testing, and imaging results were personally reviewed by me. This patient has been appropriately cleared by cardiology with an overall LOW risk for perioperative cardiovascular complications.   Based on clinical review performed today (06/13/20), barring any significant acute changes in the patient's overall condition, it is anticipated that he will be able to proceed with the planned surgical intervention. Any acute changes in clinical condition may necessitate his procedure being postponed and/or cancelled. Pre-surgical instructions were reviewed with the patient during his PAT appointment and questions were fielded by PAT clinical staff.  Honor Loh, MSN, APRN, FNP-C, CEN [facility]  Peri-operative Services Nurse Practitioner Phone: [phone] 06/13/20 2:50 PM  NOTE: This note has been prepared using Dragon dictation software. Despite my best ability to proofread, there is always the potential that unintentional transcriptional errors may still occur from this process.

## 2020-06-16 ENCOUNTER — Other Ambulatory Visit
Admission: RE | Admit: 2020-06-16 | Discharge: 2020-06-16 | Disposition: A | Discharge status: Home / Self Care | Payer: Medicare Other | Source: Ambulatory Visit | Attending: Surgery | Admitting: Surgery

## 2020-06-16 ENCOUNTER — Other Ambulatory Visit: Payer: Self-pay

## 2020-06-16 DIAGNOSIS — Z01812 Encounter for preprocedural laboratory examination: Secondary | ICD-10-CM | POA: Diagnosis present

## 2020-06-16 DIAGNOSIS — Z20822 Contact with and (suspected) exposure to covid-19: Secondary | ICD-10-CM | POA: Insufficient documentation

## 2020-06-16 LAB — SARS CORONAVIRUS 2 (TAT 6-24 HRS): SARS Coronavirus 2: NEGATIVE

## 2020-06-18 ENCOUNTER — Encounter: Payer: Self-pay | Admitting: Surgery

## 2020-06-18 ENCOUNTER — Ambulatory Visit: Payer: Medicare Other | Admitting: Urgent Care

## 2020-06-18 ENCOUNTER — Ambulatory Visit
Admission: RE | Admit: 2020-06-18 | Discharge: 2020-06-18 | Disposition: A | Discharge status: Home / Self Care | Payer: Medicare Other | Attending: Surgery | Admitting: Surgery

## 2020-06-18 ENCOUNTER — Other Ambulatory Visit: Payer: Self-pay

## 2020-06-18 ENCOUNTER — Encounter
Admission: RE | Disposition: A | Discharge status: Home / Self Care | Payer: Self-pay | Source: Home / Self Care | Attending: Surgery

## 2020-06-18 DIAGNOSIS — M1711 Unilateral primary osteoarthritis, right knee: Secondary | ICD-10-CM | POA: Diagnosis not present

## 2020-06-18 DIAGNOSIS — N400 Enlarged prostate without lower urinary tract symptoms: Secondary | ICD-10-CM | POA: Diagnosis not present

## 2020-06-18 DIAGNOSIS — Z79899 Other long term (current) drug therapy: Secondary | ICD-10-CM | POA: Insufficient documentation

## 2020-06-18 DIAGNOSIS — S83271A Complex tear of lateral meniscus, current injury, right knee, initial encounter: Secondary | ICD-10-CM | POA: Diagnosis not present

## 2020-06-18 DIAGNOSIS — M222X1 Patellofemoral disorders, right knee: Secondary | ICD-10-CM | POA: Insufficient documentation

## 2020-06-18 DIAGNOSIS — X58XXXA Exposure to other specified factors, initial encounter: Secondary | ICD-10-CM | POA: Insufficient documentation

## 2020-06-18 HISTORY — PX: KNEE ARTHROSCOPY WITH LATERAL MENISECTOMY: SHX6193

## 2020-06-18 SURGERY — ARTHROSCOPY, KNEE, WITH LATERAL MENISCECTOMY
Anesthesia: General | Site: Knee | Laterality: Right

## 2020-06-18 MED ORDER — DEXAMETHASONE SODIUM PHOSPHATE 10 MG/ML IJ SOLN
INTRAMUSCULAR | Status: DC | PRN
  Administered 2020-06-18: 5 mg via INTRAVENOUS

## 2020-06-18 MED ORDER — ONDANSETRON HCL 4 MG/2ML IJ SOLN
INTRAMUSCULAR | Status: DC | PRN
  Administered 2020-06-18: 4 mg via INTRAVENOUS

## 2020-06-18 MED ORDER — LIDOCAINE HCL (CARDIAC) PF 100 MG/5ML IV SOSY
PREFILLED_SYRINGE | INTRAVENOUS | Status: DC | PRN
  Administered 2020-06-18: 80 mg via INTRAVENOUS

## 2020-06-18 MED ORDER — LIDOCAINE HCL (PF) 1 % IJ SOLN
INTRAMUSCULAR | Status: AC
  Filled 2020-06-18: qty 30

## 2020-06-18 MED ORDER — BUPIVACAINE HCL (PF) 0.5 % IJ SOLN
INTRAMUSCULAR | Status: AC
  Filled 2020-06-18: qty 60

## 2020-06-18 MED ORDER — FENTANYL CITRATE (PF) 100 MCG/2ML IJ SOLN
INTRAMUSCULAR | Status: DC | PRN
  Administered 2020-06-18 (×2): 50 ug via INTRAVENOUS

## 2020-06-18 MED ORDER — FAMOTIDINE 20 MG PO TABS
ORAL_TABLET | ORAL | Status: AC
  Filled 2020-06-18: qty 1

## 2020-06-18 MED ORDER — PROPOFOL 10 MG/ML IV BOLUS
INTRAVENOUS | Status: AC
  Filled 2020-06-18: qty 40

## 2020-06-18 MED ORDER — FENTANYL CITRATE (PF) 100 MCG/2ML IJ SOLN
INTRAMUSCULAR | Status: AC
  Filled 2020-06-18: qty 2

## 2020-06-18 MED ORDER — CHLORHEXIDINE GLUCONATE 0.12 % MT SOLN
OROMUCOSAL | Status: AC
  Administered 2020-06-18: 15 mL via OROMUCOSAL
  Filled 2020-06-18: qty 15

## 2020-06-18 MED ORDER — ACETAMINOPHEN 10 MG/ML IV SOLN
INTRAVENOUS | Status: AC
  Filled 2020-06-18: qty 100

## 2020-06-18 MED ORDER — BUPIVACAINE-EPINEPHRINE (PF) 0.5% -1:200000 IJ SOLN
INTRAMUSCULAR | Status: DC | PRN
  Administered 2020-06-18: 50 mL via PERINEURAL

## 2020-06-18 MED ORDER — EPHEDRINE SULFATE 50 MG/ML IJ SOLN
INTRAMUSCULAR | Status: DC | PRN
  Administered 2020-06-18 (×2): 5 mg via INTRAVENOUS

## 2020-06-18 MED ORDER — CEFAZOLIN SODIUM-DEXTROSE 2-4 GM/100ML-% IV SOLN
2.0000 g | INTRAVENOUS | Status: AC
  Administered 2020-06-18: 2 g via INTRAVENOUS

## 2020-06-18 MED ORDER — EPINEPHRINE PF 1 MG/ML IJ SOLN
INTRAMUSCULAR | Status: AC
  Filled 2020-06-18: qty 1

## 2020-06-18 MED ORDER — CHLORHEXIDINE GLUCONATE 0.12 % MT SOLN: 15.0000 mL | Freq: Once | OROMUCOSAL | Status: AC

## 2020-06-18 MED ORDER — ACETAMINOPHEN 10 MG/ML IV SOLN
INTRAVENOUS | Status: DC | PRN
  Administered 2020-06-18: 1000 mg via INTRAVENOUS

## 2020-06-18 MED ORDER — PROPOFOL 10 MG/ML IV BOLUS
INTRAVENOUS | Status: DC | PRN
  Administered 2020-06-18: 150 mg via INTRAVENOUS
  Administered 2020-06-18: 50 mg via INTRAVENOUS

## 2020-06-18 MED ORDER — FAMOTIDINE 20 MG PO TABS
20.0000 mg | ORAL_TABLET | Freq: Once | ORAL | Status: AC
  Administered 2020-06-18: 20 mg via ORAL

## 2020-06-18 MED ORDER — LIDOCAINE HCL 1 % IJ SOLN
INTRAMUSCULAR | Status: DC | PRN
  Administered 2020-06-18: 30 mL

## 2020-06-18 MED ORDER — LACTATED RINGERS IV SOLN: INTRAVENOUS | Status: DC

## 2020-06-18 MED ORDER — CEFAZOLIN SODIUM-DEXTROSE 2-4 GM/100ML-% IV SOLN
INTRAVENOUS | Status: AC
  Filled 2020-06-18: qty 100

## 2020-06-18 MED ORDER — ORAL CARE MOUTH RINSE: 15.0000 mL | Freq: Once | OROMUCOSAL | Status: AC

## 2020-06-18 SURGICAL SUPPLY — 37 items
APL PRP STRL LF DISP 70% ISPRP (MISCELLANEOUS) ×1
BAG COUNTER SPONGE EZ (MISCELLANEOUS) IMPLANT
BAG SPNG 4X4 CLR HAZ (MISCELLANEOUS)
BLADE FULL RADIUS 3.5 (BLADE) ×2 IMPLANT
BLADE SHAVER 4.5X7 STR FR (MISCELLANEOUS) ×2 IMPLANT
BNDG ELASTIC 6X5.8 VLCR STR LF (GAUZE/BANDAGES/DRESSINGS) ×2 IMPLANT
CHLORAPREP W/TINT 26 (MISCELLANEOUS) ×2 IMPLANT
COVER WAND RF STERILE (DRAPES) ×2 IMPLANT
CUFF TOURN SGL QUICK 24 (TOURNIQUET CUFF) ×2
CUFF TOURN SGL QUICK 30 (TOURNIQUET CUFF)
CUFF TRNQT CYL 24X4X16.5-23 (TOURNIQUET CUFF) ×1 IMPLANT
CUFF TRNQT CYL 30X4X21-28X (TOURNIQUET CUFF) IMPLANT
DRAPE IMP U-DRAPE 54X76 (DRAPES) ×2 IMPLANT
ELECT REM PT RETURN 9FT ADLT (ELECTROSURGICAL)
ELECTRODE REM PT RTRN 9FT ADLT (ELECTROSURGICAL) IMPLANT
GAUZE SPONGE 4X4 12PLY STRL (GAUZE/BANDAGES/DRESSINGS) ×2 IMPLANT
GLOVE BIO SURGEON STRL SZ8 (GLOVE) ×4 IMPLANT
GLOVE BIOGEL M 7.0 STRL (GLOVE) ×4 IMPLANT
GLOVE BIOGEL PI IND STRL 7.5 (GLOVE) ×1 IMPLANT
GLOVE BIOGEL PI INDICATOR 7.5 (GLOVE) ×1
GLOVE INDICATOR 8.0 STRL GRN (GLOVE) ×2 IMPLANT
GOWN STRL REUS W/ TWL LRG LVL3 (GOWN DISPOSABLE) ×1 IMPLANT
GOWN STRL REUS W/ TWL XL LVL3 (GOWN DISPOSABLE) ×2 IMPLANT
GOWN STRL REUS W/TWL LRG LVL3 (GOWN DISPOSABLE) ×2
GOWN STRL REUS W/TWL XL LVL3 (GOWN DISPOSABLE) ×4
IV LACTATED RINGER IRRG 3000ML (IV SOLUTION) ×4
IV LR IRRIG 3000ML ARTHROMATIC (IV SOLUTION) ×2 IMPLANT
KIT TURNOVER KIT A (KITS) ×2 IMPLANT
MANIFOLD NEPTUNE II (INSTRUMENTS) ×2 IMPLANT
NEEDLE HYPO 21X1.5 SAFETY (NEEDLE) ×2 IMPLANT
PACK ARTHROSCOPY KNEE (MISCELLANEOUS) ×2 IMPLANT
PENCIL ELECTRO HAND CTR (MISCELLANEOUS) IMPLANT
SUT PROLENE 4 0 PS 2 18 (SUTURE) ×2 IMPLANT
SUT TICRON COATED BLUE 2 0 30 (SUTURE) IMPLANT
SYR 50ML LL SCALE MARK (SYRINGE) ×2 IMPLANT
TUBING ARTHRO INFLOW-ONLY STRL (TUBING) ×2 IMPLANT
WAND WEREWOLF FLOW 90D (MISCELLANEOUS) ×2 IMPLANT

## 2020-06-18 NOTE — Anesthesia Postprocedure Evaluation (Signed)
Anesthesia Post Note  Patient: Raymond Vazquez.  Procedure(s) Performed: RIGHT KNEE ARTHROSCOPY WITH DEBRIDEMENT AND REPAIR VERSUS PARTIAL LATERAL MENISCECTOMY. (Right Knee)  Patient location during evaluation: PACU Anesthesia Type: General Level of consciousness: awake Pain management: pain level controlled Vital Signs Assessment: post-procedure vital signs reviewed and stable Respiratory status: spontaneous breathing Cardiovascular status: stable Postop Assessment: no apparent nausea or vomiting Anesthetic complications: no   No complications documented.   Last Vitals:  Vitals:   06/18/20 1153 06/18/20 1204  BP: (!) 150/75 (!) 146/76  Pulse: (!) 57 (!) 52  Resp: 16 16  Temp: (!) 36.4 C   SpO2: 100% 100%    Last Pain:  Vitals:   06/18/20 1205  TempSrc: Oral  PainSc:                  [name]

## 2020-06-18 NOTE — Transfer of Care (Signed)
Immediate Anesthesia Transfer of Care Note  Patient: Raymond Vazquez.  Procedure(s) Performed: RIGHT KNEE ARTHROSCOPY WITH DEBRIDEMENT AND REPAIR VERSUS PARTIAL LATERAL MENISCECTOMY. (Right Knee)  Patient Location: PACU  Anesthesia Type:General  Level of Consciousness: awake  Airway & Oxygen Therapy: Patient Spontanous Breathing and Patient connected to face mask oxygen  Post-op Assessment: Report given to RN and Post -op Vital signs reviewed and stable  Post vital signs: Reviewed and stable  Last Vitals:  Vitals Value Taken Time  BP    Temp    Pulse    Resp    SpO2      Last Pain:  Vitals:   06/18/20 0841  TempSrc: Oral  PainSc: 0-No pain         Complications: No complications documented.

## 2020-06-18 NOTE — Discharge Instructions (Addendum)
Orthopedic discharge instructions: Keep dressing dry and intact.  May shower after dressing changed on post-op day #4 (Sunday).  Cover sutures with Band-Aids after drying off. Apply ice frequently to knee. Take Aleve 2 tabs BID OR ibuprofen 600-800 mg TID with meals for 7-10 days, then as necessary. Take pain medication as prescribed or ES Tylenol when needed.  May weight-bear as tolerated - use crutches or walker as needed. Follow-up in 10-14 days or as scheduled.   AMBULATORY SURGERY  DISCHARGE INSTRUCTIONS   1) The drugs that you were given will stay in your system until tomorrow so for the next 24 hours you should not:  A) Drive an automobile B) Make any legal decisions C) Drink any alcoholic beverage   2) You may resume regular meals tomorrow.  Today it is better to start with liquids and gradually work up to solid foods.  You may eat anything you prefer, but it is better to start with liquids, then soup and crackers, and gradually work up to solid foods.   3) Please notify your doctor immediately if you have any unusual bleeding, trouble breathing, redness and pain at the surgery site, drainage, fever, or pain not relieved by medication.    4) Additional Instructions:        Please contact your physician with any problems or Same Day Surgery at [phone], Monday through Friday 6 am to 4 pm, or [place] at [facility] number at [phone].

## 2020-06-18 NOTE — H&P (Signed)
History of Present Illness:  Raymond Vazquez. is a 75 y.o. male who presents today for his surgical history and physical for upcoming right knee arthroscopy with debridement and repair versus partial lateral meniscectomy. Surgery scheduled Dr. Roland Rack on 06/18/2020. The patient denies any changes in his medical history since he was last evaluated. He denies any falls or trauma affecting the right knee. Pain score is a 1 out of 10 in the right knee, he does still have discomfort with full extension of the right knee. He denies any personal history of heart attack, stroke, asthma or COPD. The patient denies any personal history of DVT.  Current Outpatient Medications: . cyanocobalamin (VITAMIN B12) 1000 MCG tablet Take 1,000 mcg by mouth once daily  . dutasteride (AVODART) 0.5 mg capsule Take 0.5 mg by mouth once daily  . glucosamine/chondr su A sod (OSTEO BI-FLEX ORAL) Take 1 tablet by mouth once daily  . multivit-min/FA/lycopen/lutein (CENTRUM SILVER MEN ORAL) Take 1 tablet by mouth once daily  . tamsulosin (FLOMAX) 0.4 mg capsule Take 0.4 mg by mouth once daily. Take 30 minutes after same meal each day.   Allergies: No Known Allergies  Past Medical History:  . Amblyopia of right eye  . Arrhythmia  . Atrial fibrillation (CMS-HCC) 03/29/2019  Resolved with IV diltiazem  . H/O cytomegalovirus infection  . History of colon polyps  . History of intussusception  . Hx of adenomatous colonic polyps 12/22/2018  . Hypertrophy (benign) of prostate  . Paroxysmal A-fib (CMS-HCC)  . S/P colonoscopy with polypectomy   Past Surgical History:  . COLONOSCOPY 08/28/2010  06/18/2009, 06/29/2006, 04/10/2005; Adenomatous Polyps: CBF 08/2015; Recall Ltr mailed 07/01/2015 (dw)  . COLONOSCOPY 09/05/2015  Adenomatous Polyps: CBF 08/2018 Recall ltr mailed  . COLONOSCOPY 01/26/2019  Tubular adenoma of the colon/Repeat 3 to 5 yrs/MUS  . Limited arthroscopic debridement, arthroscopic subacromial decompression,  mini-open rotator cuff repair using a dermal allograft, and mini-open biceps tenodesis, right shoulder. Right 06/21/2019 Dr. Roland Rack  . RESECTION SMALL BOWEL  . SIGMOIDOSCOPY FLEXIBLE 04/27/1998   Family History:  . Diabetes Mother  . Prostate cancer Father  . Alzheimer's disease Father  . Prostate cancer Paternal Grandfather  . Prostate cancer Paternal Uncle  . Diabetes Maternal Grandmother  . Peripheral Vascular Disease (PVD or blocked arteries in arms and legs) Maternal Grandmother (Had diabetes)  . Heart failure Paternal Grandmother  . Coronary Artery Disease (Blocked arteries around heart) Maternal Grandfather  . Myocardial Infarction (Heart attack) Maternal Grandfather   Social History:   Socioeconomic History:  Marland Kitchen Marital status: Married  Spouse name: Patsy  . Number of children: 1  . Years of education: Not on file  . Highest education level: Not on file  Occupational History  . Occupation: physician  Tobacco Use  . Smoking status: Never Smoker  . Smokeless tobacco: Never Used  Vaping Use  . Vaping Use: Never used  Substance and Sexual Activity  . Alcohol use: Not Currently  Alcohol/week: 0.0 standard drinks  . Drug use: No  . Sexual activity: Not Currently  Partners: Female  Other Topics Concern  . Not on file  Social History Narrative  . Not on file   Social Determinants of Health:   Financial Resource Strain: Not on file  Food Insecurity: Not on file  Transportation Needs: Not on file   Review of Systems:  A comprehensive 14 point ROS was performed, reviewed, and the pertinent orthopaedic findings are documented in the HPI.  Physical Exam: Vitals:  06/16/20 1121  BP: 138/80  Height: 172.7 cm (5\' 8" )  PainSc: 1  PainLoc: Knee   General/Constitutional: The patient appears to be well-nourished, well-developed, and in no acute distress. Neuro/Psych: Normal mood and affect, oriented to person, place and time. Eyes: Non-icteric. Pupils are equal, round,  and reactive to light, and exhibit synchronous movement. ENT: Unremarkable. Lymphatic: No palpable adenopathy. Respiratory: Lungs clear to auscultation, Normal chest excursion, No wheezes and Non-labored breathing Cardiovascular: Regular rate and rhythm. No murmurs. and No edema, swelling or tenderness, except as noted in detailed exam. Integumentary: No impressive skin lesions present, except as noted in detailed exam. Musculoskeletal: Unremarkable, except as noted in detailed exam.  Right knee exam: GAIT: normal and uses no assistive devices. ALIGNMENT: normal SKIN: unremarkable SWELLING: minimal EFFUSION: trace WARMTH: no warmth TENDERNESS: Moderate focal tenderness over anterolateral joint line  ROM: full without pain McMURRAY'S: negative PATELLOFEMORAL: normal tracking with no peri-patellar tenderness and negative apprehension sign CREPITUS: no LACHMAN'S: negative PIVOT SHIFT: negative ANTERIOR DRAWER: negative POSTERIOR DRAWER: negative VARUS/VALGUS: stable  He is neurovascularly intact to the right lower extremity and foot.  X-rays/MRI/Lab data:  A recent MRI scan of the right knee is available for review. By report, the scan demonstrates evidence of a "horizontal tear reaching the femoral articular surface (involving) the anterior body and anterior horn." There also is evidence of mild tricompartmental degenerative changes with a focal "near full-thickness defect along the posterior weightbearing lateral femoral condyle measures 0.7 cm transverse by 1.2 cm AP". The medial meniscus is intact as are the anterior and posterior cruciate ligaments. Both the films and report were reviewed by myself and discussed with the patient.  Assessment: . Primary osteoarthritis of right knee.  . Patellofemoral syndrome of right knee.  . Complex tear of lateral meniscus of right knee.   Plan: 1. Treatment options were discussed today with the patient. 2. The patient is scheduled to  undergo a right knee arthroscopy with debridement and repair versus partial lateral meniscectomy. Surgery scheduled Dr. Roland Rack on 06/18/2020. 3. The patient was instructed on the risk and benefits of surgery and wishes to proceed with surgery at this time. 4. This document will serve as a surgical history and physical for the patient. 5. The patient will follow-up per standard postop protocol. 6. He can contact the clinic if he has any questions, new symptoms develop or symptoms worsen.  The procedure was discussed with the patient, as were the potential risks (including bleeding, infection, nerve and/or blood vessel injury, persistent or recurrent pain, failure of the repair, progression of arthritis, need for further surgery, blood clots, strokes, heart attacks and/or arhythmias, pneumonia, etc.) and benefits. The patient states his understanding and wishes to proceed.   H&P reviewed and patient re-examined. No changes.

## 2020-06-18 NOTE — Anesthesia Preprocedure Evaluation (Signed)
Anesthesia Evaluation  Patient identified by MRN, date of birth, ID band Patient awake    Reviewed: Allergy & Precautions, H&P , NPO status , Patient's Chart, lab work & pertinent test results  Airway Mallampati: II       Dental no notable dental hx.    Pulmonary pneumonia,    Pulmonary exam normal breath sounds clear to auscultation       Cardiovascular Normal cardiovascular exam+ dysrhythmias Atrial Fibrillation  Rhythm:Regular Rate:Normal  H/o Afib X1 - converted, was on anticoag for a month.  No further issues, followed appropriately   Neuro/Psych negative neurological ROS  negative psych ROS   GI/Hepatic negative GI ROS, Neg liver ROS,   Endo/Other  negative endocrine ROS  Renal/GU negative Renal ROS  negative genitourinary   Musculoskeletal negative musculoskeletal ROS (+)   Abdominal   Peds negative pediatric ROS (+)  Hematology negative hematology ROS (+)   Anesthesia Other Findings Past Medical History: No date: A-fib (HCC) No date: Amblyopia, right eye No date: Complication of anesthesia     Comment:  episode of bigeminy during colonoscopy No date: History of colon polyps No date: Hypertrophy (benign) of prostate No date: Intussusception (HCC) No date: Pneumonia   Reproductive/Obstetrics negative OB ROS                             Anesthesia Physical Anesthesia Plan  ASA: II  Anesthesia Plan: General   Post-op Pain Management:    Induction: Intravenous  PONV Risk Score and Plan: 2 and Ondansetron and Dexamethasone  Airway Management Planned: LMA  Additional Equipment:   Intra-op Plan:   Post-operative Plan:   Informed Consent: I have reviewed the patients History and Physical, chart, labs and discussed the procedure including the risks, benefits and alternatives for the proposed anesthesia with the patient or authorized representative who has indicated  his/her understanding and acceptance.       Plan Discussed with: CRNA, Anesthesiologist and Surgeon  Anesthesia Plan Comments:         Anesthesia Quick Evaluation

## 2020-06-18 NOTE — Op Note (Signed)
06/18/2020  11:02 AM  Patient:   Raymond Vazquez.  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  Postoperative diagnosis:   Same  Procedure:   Arthroscopic debridement with partial lateral meniscectomy, right knee.  Surgeon:   Pascal Lux, MD  Anesthesia:   General LMA  Findings:   As above. There was a horizontal tear involving the anterior half of the lateral meniscus with an unstable inferior flap component. The medial meniscus was in satisfactory condition, as were the anterior and posterior cruciate ligaments. There were diffuse grade 2 chondromalacial changes involving all articular surfaces.  Complications:   None  EBL:   3 cc.  Total fluids:   500 cc of crystalloid.  Tourniquet time:   None  Drains:   None  Closure:   4-0 Prolene interrupted sutures.  Brief clinical note:   The patient is a 75 year old male with a 4+ month history of lateral right knee pain. His symptoms have progressed despite medications, activity modification, etc. His history and examination are consistent with a lateral meniscus tear with underlying degenerative joint disease, all of which were confirmed by a recent MRI scan. The patient presents at this time for arthroscopy, debridement, and repair versus partial lateral meniscectomy.  Procedure:   The patient was brought into the operating room and lain in the supine position. After adequate general laryngeal mask anesthesia was obtained, a timeout was performed to verify the appropriate side. The patient's right knee was injected sterilely using a solution of 30 cc of 1% lidocaine and 30 cc of 0.5% Sensorcaine with epinephrine. The right lower extremity was prepped with ChloraPrep solution before being draped sterilely. Preoperative antibiotics were administered. The expected portal sites were injected with 0.5% Sensorcaine with epinephrine before the camera was placed in the  anterolateral portal and instrumentation performed through the anteromedial portal.   The knee was sequentially examined beginning in the suprapatellar pouch, then progressing to the patellofemoral space, the medial gutter and compartment, the notch, and finally the lateral compartment and gutter. The findings were as described above. Abundant reactive synovial tissues anteriorly were debrided using the full-radius resector in order to improve visualization. The lateral meniscus was carefully probed with the findings as described above. Given the patient's age, diffuse degenerative changes, and relative instability of the inferior component of the horizontal tear, it was felt best to perform a partial meniscectomy of the anterior inferior portion of the anterior horn of the lateral meniscus. This was performed using side biting and backbiting baskets as well as the full-radius resector.  Subsequent probing of the remaining rim demonstrated good stability. The instruments were removed from the joint after suctioning the excess fluid.   The portal sites were closed using 4-0 Prolene interrupted sutures before a sterile bulky dressing was applied to the knee. The patient was then awakened, extubated, and returned to the recovery room in satisfactory condition after tolerating the procedure well.

## 2020-06-18 NOTE — Anesthesia Procedure Notes (Signed)
Procedure Name: LMA Insertion Date/Time: 06/18/2020 10:04 AM Performed by: Chanetta Marshall, CRNA Pre-anesthesia Checklist: Patient identified, Emergency Drugs available, Suction available and Patient being monitored Patient Re-evaluated:Patient Re-evaluated prior to induction Oxygen Delivery Method: Circle system utilized Preoxygenation: Pre-oxygenation with 100% oxygen Induction Type: IV induction Ventilation: Mask ventilation without difficulty LMA: LMA inserted LMA Size: 4.0 Number of attempts: 1 Placement Confirmation: positive ETCO2,  breath sounds checked- equal and bilateral and CO2 detector Tube secured with: Tape Dental Injury: Teeth and Oropharynx as per pre-operative assessment

## 2020-10-23 IMAGING — MR MR SHOULDER*R* W/O CM
5 series · 32 of 40 positions shown · non-contrast
Comparison: None.

CLINICAL DATA: Right shoulder pain and limited range of motion for
6 months.

EXAM:
MRI OF THE RIGHT SHOULDER WITHOUT CONTRAST
TECHNIQUE: Multiplanar, multisequence MR imaging of the shoulder was performed.
No intravenous contrast was administered.

[Series 5: PD fat-sat · axial · right · 4.0mm · 0.55mm/px · z∈[-40,+90]mm · 8 of 28 slices shown (1 of 2)]
[im 1/28]
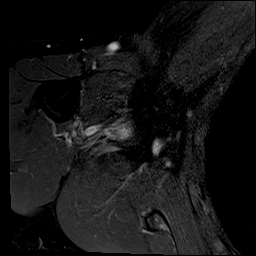
[im 4/28]
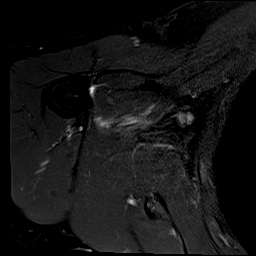
[im 10/28]
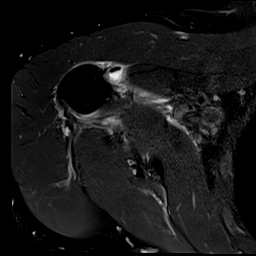
[im 13/28]
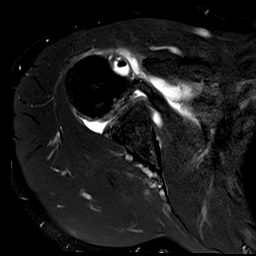
[im 16/28]
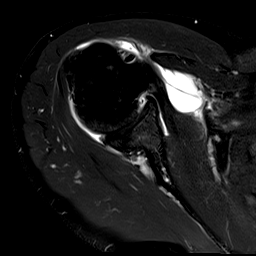
[im 19/28]
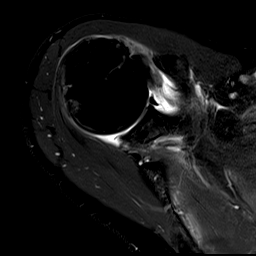
[im 25/28]
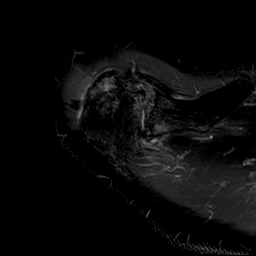
[im 28/28]
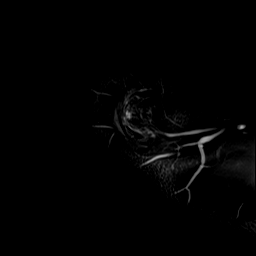

[Series 6: PD fat-sat · oblique · right · 4.0mm · 0.44mm/px · 8 of 26 slices shown (2 of 2)]
[im 1/26]
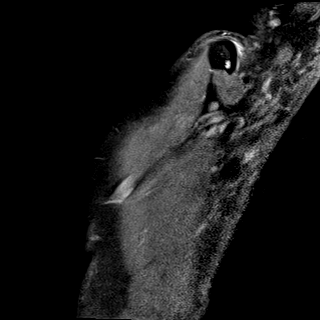
[im 4/26]
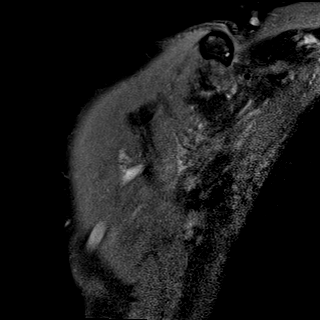
[im 8/26]
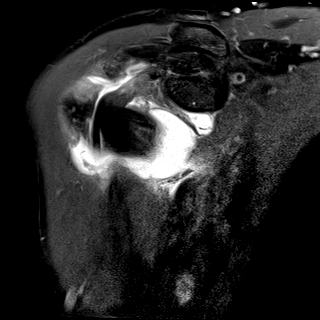
[im 11/26]
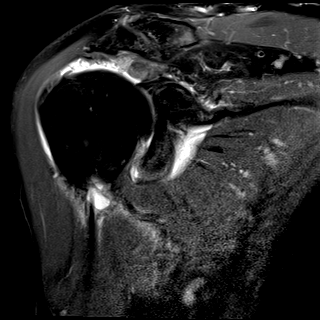
[im 15/26]
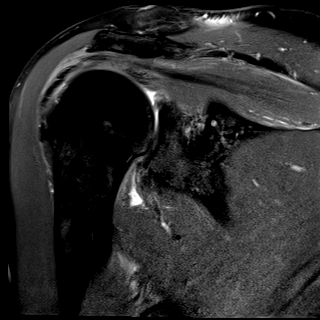
[im 18/26]
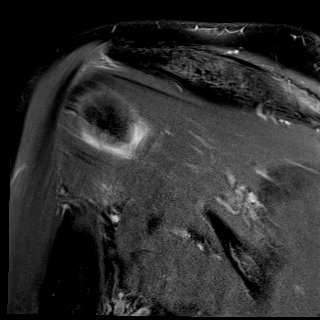
[im 22/26]
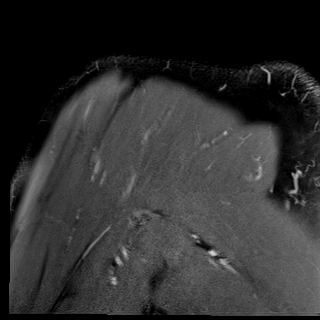
[im 26/26]
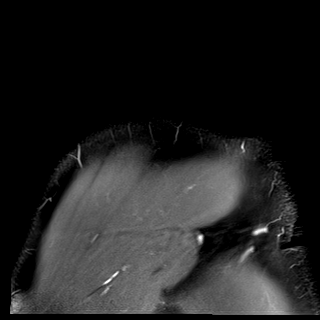

[Series 7: T2 fat-sat · oblique · right · 4.0mm · 0.44mm/px · 8 of 26 slices shown (1 of 2)]
[im 1/26]
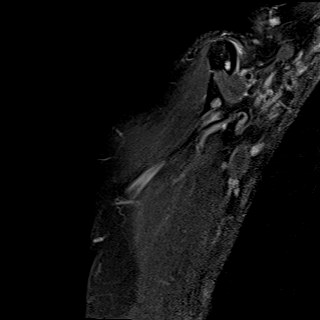
[im 4/26]
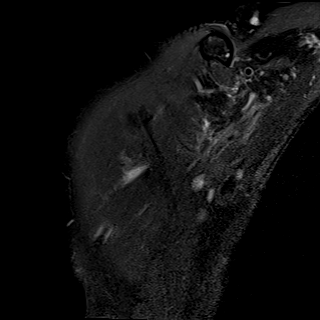
[im 8/26]
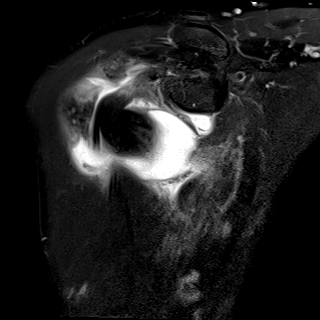
[im 11/26]
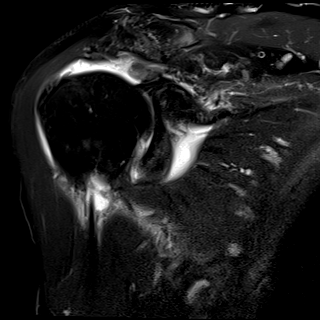
[im 15/26]
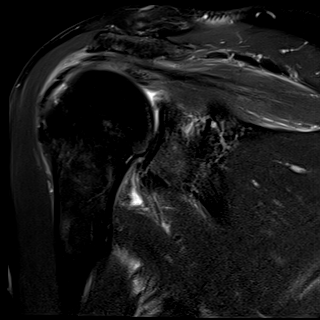
[im 18/26]
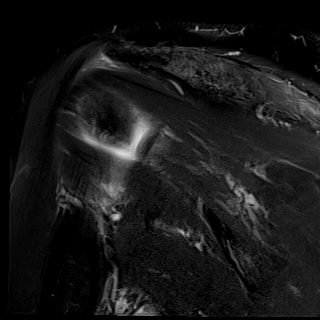
[im 22/26]
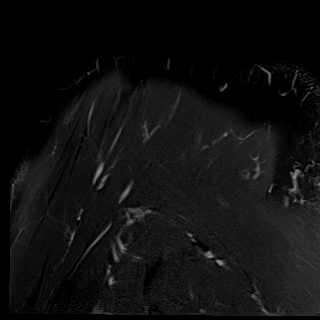
[im 26/26]
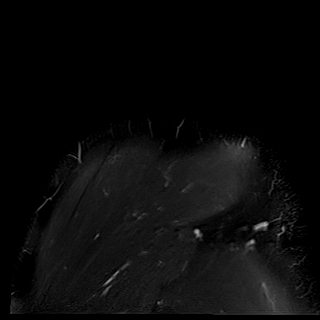

[Series 8: T2 fat-sat · oblique · right · 4.0mm · 0.23mm/px · 7 of 22 slices shown (2 of 2)]
[im 1/22]
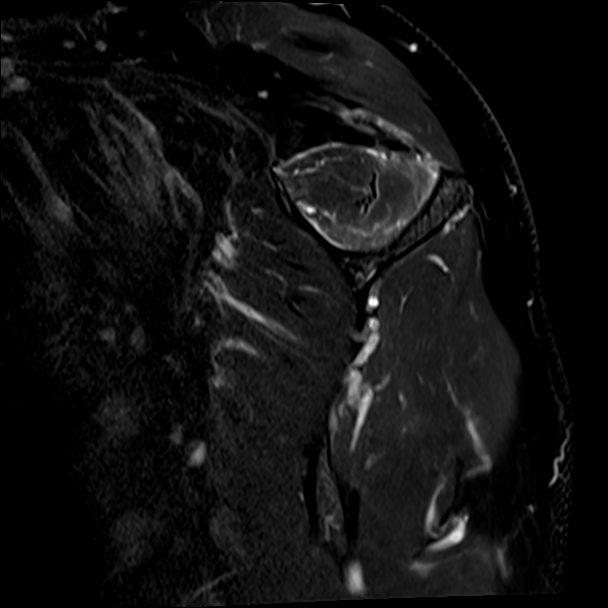
[im 4/22]
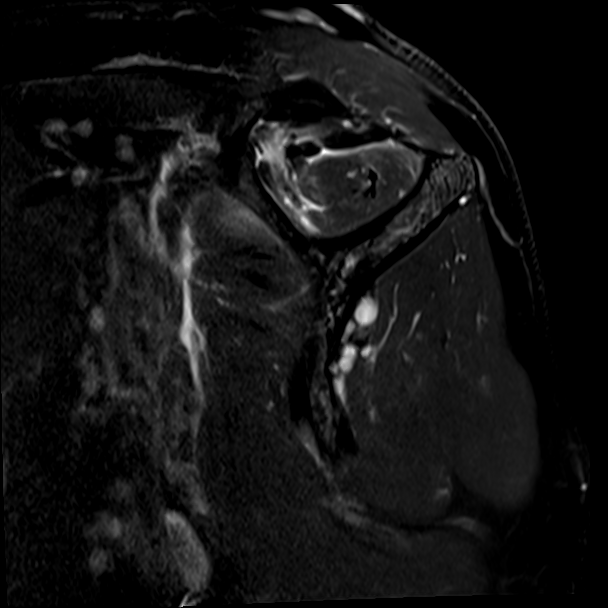
[im 8/22]
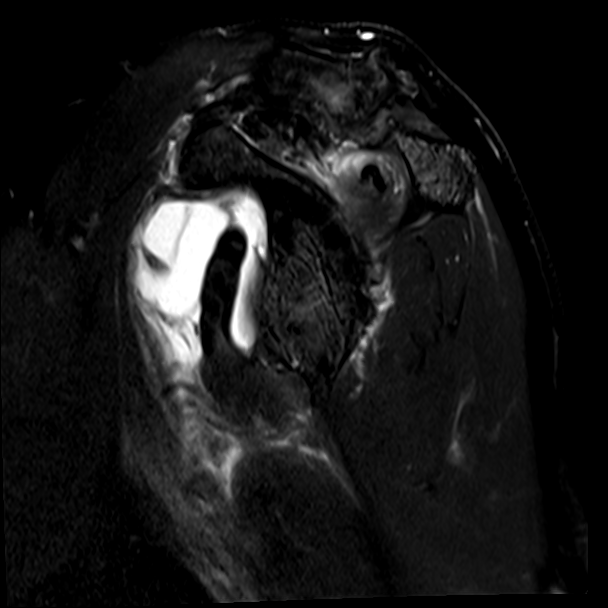
[im 11/22]
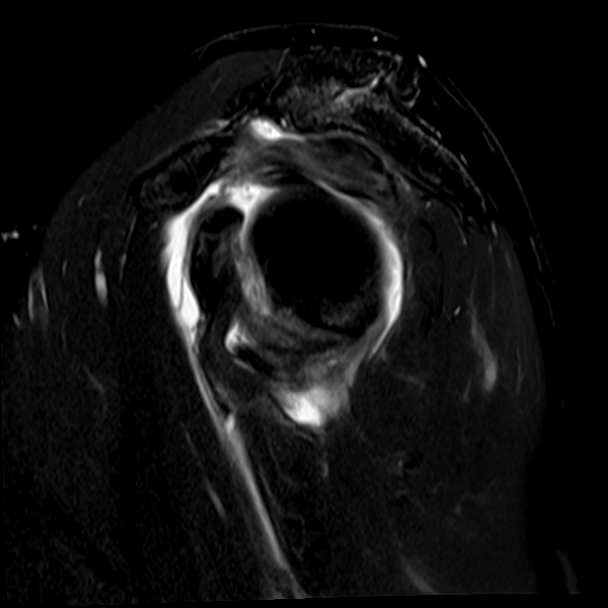
[im 15/22]
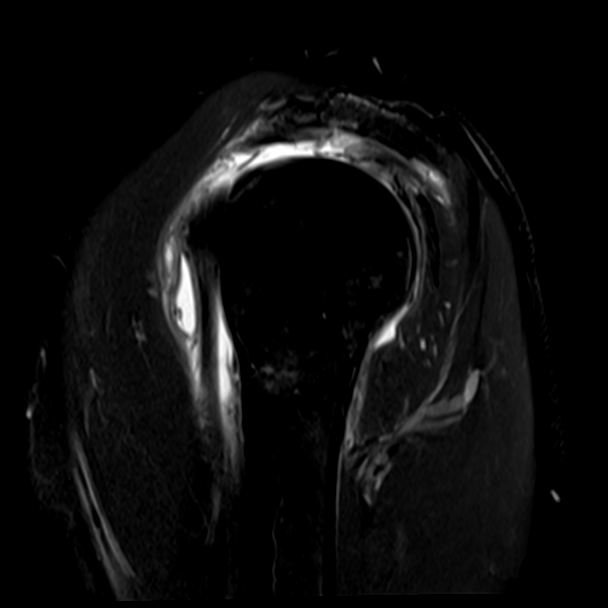
[im 18/22]
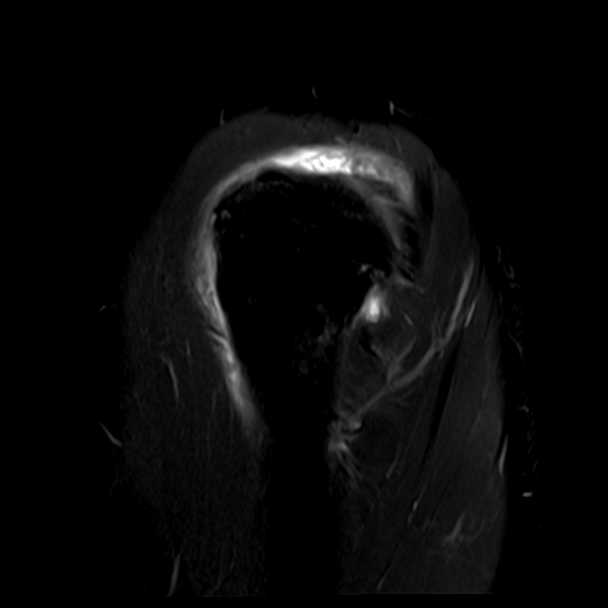
[im 22/22]
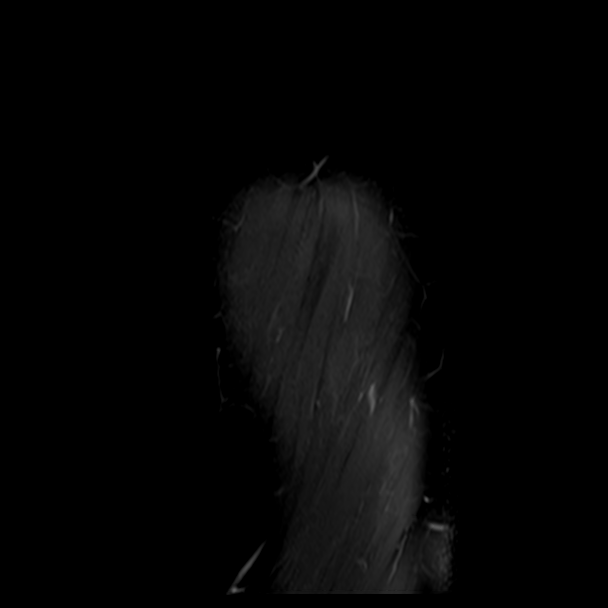

[Series 9: T1 · oblique · right · 4.0mm · 0.36mm/px · 1 of 22 slices shown]
[im 1/22]
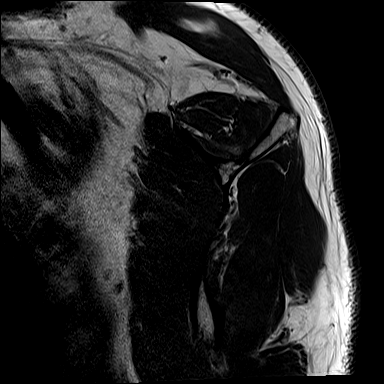

[32 of 40 positions shown; findings below may reference images not displayed]

FINDINGS: Rotator cuff: Large full-thickness retracted rotator cuff tear. The
supraspinatus tendon is torn and retracted approximately 3 cm. The
tear is approximately 2.5 cm wide. The infraspinatus tendon is
intact. Moderate tendinopathy and small interstitial tears. Some of
the upper most fibers of the subscapularis tendon are torn but the
main mid and lower fibers are intact.

Muscles: Mild edema like signal changes in the supraspinatus muscle
but no significant fatty atrophy.

Biceps long head:  Intact.

Acromioclavicular Joint: Advanced degenerative changes with superior
and inferior spurring and fragmentation superiorly. Type 2 acromion.
No lateral downsloping or undersurface spurring.

Glenohumeral Joint: Moderate degenerative changes with cartilage
thinning, early spurring and subchondral cystic change. There is a
moderate-sized joint effusion.

Labrum:  Labral degenerative changes without definite tear.

Bones:  No acute bony findings.

Other: Expected fluid in the subacromial/subdeltoid bursa.
IMPRESSION: 1. Full-thickness retracted tear of the supraspinatus tendon. Some
of the upper fibers of the subscapularis tendon are also torn. The
infraspinatus tendon is intact.
2. Intact long head biceps tendon and glenoid labrum.
3. Advanced AC joint degenerative changes but no significant lateral
downsloping of a type 2 acromion.
4. Mild/moderate glenohumeral joint degenerative changes.

## 2022-01-23 ENCOUNTER — Emergency Department
Admission: EM | Admit: 2022-01-23 | Discharge: 2022-01-23 | Disposition: A | Discharge status: Home / Self Care | Payer: Medicare Other | Attending: Emergency Medicine | Admitting: Emergency Medicine

## 2022-01-23 ENCOUNTER — Other Ambulatory Visit: Payer: Self-pay

## 2022-01-23 DIAGNOSIS — R002 Palpitations: Secondary | ICD-10-CM | POA: Insufficient documentation

## 2022-01-23 DIAGNOSIS — Z8616 Personal history of COVID-19: Secondary | ICD-10-CM | POA: Diagnosis not present

## 2022-01-23 LAB — BASIC METABOLIC PANEL
Anion gap: 5 (ref 5–15)
BUN: 22 mg/dL (ref 8–23)
CO2: 25 mmol/L (ref 22–32)
Calcium: 8.6 mg/dL — ABNORMAL LOW (ref 8.9–10.3)
Chloride: 109 mmol/L (ref 98–111)
Creatinine, Ser: 0.8 mg/dL (ref 0.61–1.24)
GFR, Estimated: >60 mL/min (ref >60)
Glucose, Bld: 144 mg/dL — ABNORMAL HIGH (ref 70–99)
Potassium: 3.5 mmol/L (ref 3.5–5.1)
Sodium: 139 mmol/L (ref 135–145)

## 2022-01-23 LAB — CBC WITH DIFFERENTIAL/PLATELET
Abs Immature Granulocytes: 0.03 10*3/uL (ref 0.00–0.07)
Basophils Absolute: 0 10*3/uL (ref 0.0–0.1)
Basophils Relative: 0 %
Eosinophils Absolute: 0.2 10*3/uL (ref 0.0–0.5)
Eosinophils Relative: 2 %
HCT: 43.4 % (ref 39.0–52.0)
Hemoglobin: 14.3 g/dL (ref 13.0–17.0)
Immature Granulocytes: 0 %
Lymphocytes Relative: 36 %
Lymphs Abs: 2.4 10*3/uL (ref 0.7–4.0)
MCH: 28.1 pg (ref 26.0–34.0)
MCHC: 32.9 g/dL (ref 30.0–36.0)
MCV: 85.4 fL (ref 80.0–100.0)
Monocytes Absolute: 0.6 10*3/uL (ref 0.1–1.0)
Monocytes Relative: 9 %
Neutro Abs: 3.6 10*3/uL (ref 1.7–7.7)
Neutrophils Relative %: 53 %
Platelets: 220 10*3/uL (ref 150–400)
RBC: 5.08 MIL/uL (ref 4.22–5.81)
RDW: 12.3 % (ref 11.5–15.5)
WBC: 6.8 10*3/uL (ref 4.0–10.5)
nRBC: 0 % (ref 0.0–0.2)

## 2022-01-23 NOTE — ED Notes (Signed)
Pt gives verbal consent to DC 

## 2022-01-23 NOTE — ED Provider Notes (Addendum)
[facility] Provider Note    Event Date/Time   First MD Initiated Contact with Patient 01/23/22 1453     (approximate)   History   Tachycardia   HPI  Gerrit Rafalski. is a 76 y.o. male  with pmh a fib, BPH who presents with t an episode of palpitations.  Patient was eating lunch when he suddenly felt like his heart was fluttering.  Lasted for several minutes.  He used a cardiac monitor that he has at home where he puts his fingers on the monitor and it gives him an EKG reading says it is possible A-fib.  He did not check his heart rate.  Patient has history of 1 prior episode of A-fib in 2020.  He is not anticoagulated.  Did take Eliquis and diltiazem for a month but cardiology [place] this.  He followed up with Dr. Alveria Apley office for his yearly checkup recently.  Has never worn a cardiac monitor in the outpatient setting.  Patient does note that about 10 days ago he developed fever and runny nose and took a home COVID test that was positive.  His primary care office called him Paxlovid.  He is now feeling improved denies nausea vomiting abdominal pain diarrhea ongoing fevers.  He denies any chest pain or shortness of breath with this episode today or in general.     Past Medical History:  Diagnosis Date   A-fib (HCC)    Amblyopia, right eye    Complication of anesthesia    episode of bigeminy during colonoscopy   History of colon polyps    Hypertrophy (benign) of prostate    Intussusception ([place])    Pneumonia     Patient Active Problem List   Diagnosis Date Noted   Atrial fibrillation ([place]) 03/29/2019     Physical Exam  Triage Vital Signs: ED Triage Vitals  Enc Vitals Group     BP 01/23/22 1442 (!) 144/85     Pulse Rate 01/23/22 1442 89     Resp 01/23/22 1442 18     Temp 01/23/22 1442 99.1 F (37.3 C)     Temp Source 01/23/22 1442 Oral     SpO2 01/23/22 1442 95 %     Weight 01/23/22 1438 158 lb (71.7 kg)     Height 01/23/22 1438 '5\' 9"'$   (1.753 m)     Head Circumference --      Peak Flow --      Pain Score 01/23/22 1438 0     Pain Loc --      Pain Edu? --      Excl. in [place]? --     Most recent vital signs: Vitals:   01/23/22 1442  BP: (!) 144/85  Pulse: 89  Resp: 18  Temp: 99.1 F (37.3 C)  SpO2: 95%     General: Awake, no distress.  CV:  Good peripheral perfusion.  No peripheral edema Resp:  Normal effort.  No increased work of breathing Abd:  No distention.  Abdomen soft and nontender Neuro:             Awake, Alert, Oriented x 3  Other:     ED Results / Procedures / Treatments  Labs (all labs ordered are listed, but only abnormal results are displayed) Labs Reviewed  BASIC METABOLIC PANEL - Abnormal; Notable for the following components:      Result Value   Glucose, Bld 144 (*)    Calcium 8.6 (*)  All other components within normal limits  CBC WITH DIFFERENTIAL/PLATELET     EKG  ECG interpreted by myself shows NSR, LAD, PRWP, no acute ischemic changes   RADIOLOGY    PROCEDURES:  Critical Care performed: No  Procedures  The patient is on the cardiac monitor to evaluate for evidence of arrhythmia and/or significant heart rate changes.   MEDICATIONS ORDERED IN ED: Medications - No data to display   IMPRESSION / MDM / [place] / ED COURSE  I reviewed the triage vital signs and the nursing notes.                              Patient's presentation is most consistent with acute complicated illness / injury requiring diagnostic workup.  Differential diagnosis includes, but is not limited to, atrial fibrillation, SVT, PACs  The patient is 76 year old male with history of single episode of A-fib in the past who is not anticoagulated currently presents with an episode of palpitations that lasted for several minutes today he did not have associated pain in the chest or shortness of breath.  Used a home cardiac monitor which told him that he could have atrial fibrillation  currently he is in normal sinus rhythm and is asymptomatic did have recent COVID illness but is feeling fine from that standpoint.  We will check basic labs including CBC and BMP.  We will have him on telemetry in the ED.  Patient's CHA2DS2-VASc 2 score is 1 together with the uncertainty of whether this was truly even A-fib today would not start him back on Eliquis just based on the history.  Patient shows me his rhythm strip from the home cardiac monitor and does look like possibly A-fib heart rate in the 170s.  CBC and BMP are reassuring.  Patient remained in normal sinus rhythm throughout ED stay.  He is appropriate for discharge we will have him follow-up with cardiology.  I did advise that he start taking baby aspirin given CHA2DS2-VASc of 1.  Advised that he follow-up with Dr. Alveria Apley office.      FINAL CLINICAL IMPRESSION(S) / ED DIAGNOSES   Final diagnoses:  Palpitations     Rx / DC Orders   ED Discharge Orders     None        Note:  This document was prepared using Dragon voice recognition software and may include unintentional dictation errors.   Rada Hay, MD 01/23/22 1604    Rada Hay, MD 01/23/22 1620

## 2022-01-23 NOTE — ED Triage Notes (Signed)
Pt states that he was sitting and eating lunch when he started to feel lightheaded and like his heart was racing- pt states that he feels like he is in afib- pt states this has happened to him before- pt denies any chest pain- pt states that he tested positive for covid last week and did still test positive yesterday

## 2022-01-23 NOTE — Discharge Instructions (Signed)
You are in normal sinus rhythm in the emergency department.  It is possible that you are in A-fib earlier.  Your blood work was all reassuring.  Please follow-up with Dr. Alveria Apley office if you have any further episodes of heart fluttering or palpitations that do not resolve please return to the emergency department.

## 2022-02-16 DIAGNOSIS — I5189 Other ill-defined heart diseases: Secondary | ICD-10-CM

## 2022-02-16 HISTORY — DX: Other ill-defined heart diseases: I51.89

## 2022-12-22 ENCOUNTER — Ambulatory Visit: Payer: Self-pay | Admitting: Urology

## 2022-12-23 ENCOUNTER — Encounter: Payer: Self-pay | Admitting: Urology

## 2022-12-23 ENCOUNTER — Ambulatory Visit: Payer: Medicare Other | Admitting: Urology

## 2022-12-23 VITALS — BP 137/69 | HR 64 | Ht 69.0 in | Wt 155.0 lb

## 2022-12-23 DIAGNOSIS — R972 Elevated prostate specific antigen [PSA]: Secondary | ICD-10-CM

## 2022-12-23 DIAGNOSIS — N401 Enlarged prostate with lower urinary tract symptoms: Secondary | ICD-10-CM

## 2022-12-23 DIAGNOSIS — Z8042 Family history of malignant neoplasm of prostate: Secondary | ICD-10-CM

## 2022-12-23 DIAGNOSIS — Z125 Encounter for screening for malignant neoplasm of prostate: Secondary | ICD-10-CM | POA: Diagnosis not present

## 2022-12-23 MED ORDER — TAMSULOSIN HCL 0.4 MG PO CAPS: 0.4000 mg | ORAL_CAPSULE | Freq: Every day | ORAL | 3 refills | Status: DC

## 2022-12-23 MED ORDER — DUTASTERIDE 0.5 MG PO CAPS: 0.5000 mg | ORAL_CAPSULE | Freq: Every day | ORAL | 3 refills | Status: DC

## 2022-12-23 NOTE — Progress Notes (Signed)
Raymond Vazquez,acting as a scribe for Riki Altes, MD.,have documented all relevant documentation on the behalf of Riki Altes, MD,as directed by  Riki Altes, MD while in the presence of Riki Altes, MD.  12/23/2022 1:42 PM   Raymond Vazquez. 03-02-1946 [id number]  Referring provider: Lauro Regulus, MD [address] [facility] [place],  [place] [address]  Chief Complaint  Patient presents with   Other    HPI:  Raymond Vazquez. is a 77 y.o. male who presents to establish local urologic care.   He previously saw Dr. Orson Slick and underwent a prostate biopsy in 03/2002 for abnormal PSA velocity of 1.5-3.5 with benign pathology. Subsequently underwent a repeat prostate biopsy with Dr. Evelene Croon in 2010 for a PSA of 5.5 with benign pathology.  Prostate volume was 71 cc's and he was started on combination tamsulosin/dutasteride On record review, he last saw Dr. Evelene Croon in 09/2012 and PSA at that time was 2.4 On Epic review, it appears that his last PSA was drawn in 01/2018 and was 1.25. Although on record review, his last visit with Dr. Evelene Croon was in 2014, he states that he has been seeing him annually and last saw him a little over a year ago.  He remains on dutasteride and tamsulosin and has no bothersome lower urinary tract symptoms. He states his PSA's the last several years have been in the 1-2 range. Denies dysuria, gross hematuria No flank, abdominal, or pelvic pain   PMH: Past Medical History:  Diagnosis Date   A-fib (HCC)    Amblyopia, right eye    Complication of anesthesia    episode of bigeminy during colonoscopy   History of colon polyps    Hypertrophy (benign) of prostate    Intussusception [facility])    Pneumonia     Surgical History: Past Surgical History:  Procedure Laterality Date   COLON SURGERY     COLONOSCOPY  2006, 2008, 2011, 2012   COLONOSCOPY WITH PROPOFOL N/A 09/05/2015   Procedure: COLONOSCOPY WITH  PROPOFOL;  Surgeon: Scot Jun, MD;  Location: [facility] ENDOSCOPY;  Service: Endoscopy;  Laterality: N/A;   COLONOSCOPY WITH PROPOFOL N/A 01/26/2019   Procedure: COLONOSCOPY WITH PROPOFOL;  Surgeon: Christena Deem, MD;  Location: [facility] ENDOSCOPY;  Service: Endoscopy;  Laterality: N/A;   KNEE ARTHROSCOPY WITH LATERAL MENISECTOMY Right 06/18/2020   Procedure: RIGHT KNEE ARTHROSCOPY WITH DEBRIDEMENT AND REPAIR VERSUS PARTIAL LATERAL MENISCECTOMY.;  Surgeon: Christena Flake, MD;  Location: [facility] ORS;  Service: Orthopedics;  Laterality: Right;   SHOULDER ARTHROSCOPY WITH ROTATOR CUFF REPAIR AND OPEN BICEPS TENODESIS Right 06/21/2019   Procedure: SHOULDER ARTHROSCOPY DEBRIDEMENT, DECOMPRESSION, AND REPAIR OF MASSIVE  ROTATOR CUFF TEAR WITH BICEPS TENODESIS.;  Surgeon: Christena Flake, MD;  Location: [facility] ORS;  Service: Orthopedics;  Laterality: Right;   SMALL BOWEL REPAIR  age 22   WISDOM TOOTH EXTRACTION      Home Medications:  Allergies as of 12/23/2022   No Known Allergies      Medication List        Accurate as of December 23, 2022  1:42 PM. If you have any questions, ask your nurse or doctor.          cyanocobalamin 1000 MCG tablet Take 1,000 mcg by mouth daily.   dutasteride 0.5 MG capsule Commonly known as: AVODART Take 0.5 mg by mouth at bedtime.   multivitamin tablet Take 1 tablet by mouth daily.  tamsulosin 0.4 MG Caps capsule Commonly known as: FLOMAX Take 0.4 mg by mouth at bedtime.        Family History: Family History  Problem Relation Age of Onset   Diabetes Mother    Prostate cancer Father    Alzheimer's disease Father     Social History:  reports that he has never smoked. He has never used smokeless tobacco. He reports that he does not drink alcohol and does not use drugs.   Physical Exam: BP 137/69   Pulse 64   Ht 5\' 9"  (1.753 m)   Wt 155 lb (70.3 kg)   BMI 22.89 kg/m   Constitutional:  Alert and oriented, No acute distress. HEENT: [place] AT, moist  mucus membranes.  Trachea midline, no masses. Cardiovascular: No clubbing, cyanosis, or edema. Respiratory: Normal respiratory effort, no increased work of breathing. GI: Abdomen is soft, nontender, nondistended, no abdominal masses GU: Prostate 50 grams, smooth without nodules Skin: No rashes, bruises or suspicious lesions. Neurologic: Grossly intact, no focal deficits, moving all 4 extremities. Psychiatric: Normal mood and affect.  Assessment & Plan:    1. BPH with LUTS Stable on tamsulosin/dutasteride Medication refilled Continue annual follow up  2. History of elevated PSA Previous negative prostate biopsy x2 PSA drawn today He has a strong family history of prostate cancer and desires to continue annual PSA/DRE   [facility] [address] [place], [place] [address] [phone]

## 2022-12-24 LAB — PSA: Prostate Specific Ag, Serum: 1.1 ng/mL (ref 0.0–4.0)

## 2022-12-26 ENCOUNTER — Encounter: Payer: Self-pay | Admitting: Urology

## 2023-01-27 ENCOUNTER — Other Ambulatory Visit: Payer: Self-pay

## 2023-01-27 ENCOUNTER — Emergency Department
Admission: EM | Admit: 2023-01-27 | Discharge: 2023-01-27 | Disposition: A | Discharge status: Home / Self Care | Payer: Medicare Other | Attending: Emergency Medicine | Admitting: Emergency Medicine

## 2023-01-27 DIAGNOSIS — I4891 Unspecified atrial fibrillation: Secondary | ICD-10-CM | POA: Diagnosis not present

## 2023-01-27 DIAGNOSIS — R946 Abnormal results of thyroid function studies: Secondary | ICD-10-CM | POA: Diagnosis not present

## 2023-01-27 DIAGNOSIS — R002 Palpitations: Secondary | ICD-10-CM | POA: Diagnosis present

## 2023-01-27 LAB — CBC WITH DIFFERENTIAL/PLATELET
Abs Immature Granulocytes: 0.02 10*3/uL (ref 0.00–0.07)
Basophils Absolute: 0 10*3/uL (ref 0.0–0.1)
Basophils Relative: 0 %
Eosinophils Absolute: 0.5 10*3/uL (ref 0.0–0.5)
Eosinophils Relative: 5 %
HCT: 47.5 % (ref 39.0–52.0)
Hemoglobin: 15.9 g/dL (ref 13.0–17.0)
Immature Granulocytes: 0 %
Lymphocytes Relative: 47 %
Lymphs Abs: 4.3 10*3/uL — ABNORMAL HIGH (ref 0.7–4.0)
MCH: 28.7 pg (ref 26.0–34.0)
MCHC: 33.5 g/dL (ref 30.0–36.0)
MCV: 85.7 fL (ref 80.0–100.0)
Monocytes Absolute: 0.8 10*3/uL (ref 0.1–1.0)
Monocytes Relative: 8 %
Neutro Abs: 3.8 10*3/uL (ref 1.7–7.7)
Neutrophils Relative %: 40 %
Platelets: 206 10*3/uL (ref 150–400)
RBC: 5.54 MIL/uL (ref 4.22–5.81)
RDW: 12.3 % (ref 11.5–15.5)
WBC: 9.4 10*3/uL (ref 4.0–10.5)
nRBC: 0 % (ref 0.0–0.2)

## 2023-01-27 LAB — COMPREHENSIVE METABOLIC PANEL
ALT: 20 U/L (ref 0–44)
AST: 25 U/L (ref 15–41)
Albumin: 4.2 g/dL (ref 3.5–5.0)
Alkaline Phosphatase: 47 U/L (ref 38–126)
Anion gap: 10 (ref 5–15)
BUN: 21 mg/dL (ref 8–23)
CO2: 23 mmol/L (ref 22–32)
Calcium: 9.2 mg/dL (ref 8.9–10.3)
Chloride: 105 mmol/L (ref 98–111)
Creatinine, Ser: 0.8 mg/dL (ref 0.61–1.24)
GFR, Estimated: >60 mL/min (ref >60)
Glucose, Bld: 107 mg/dL — ABNORMAL HIGH (ref 70–99)
Potassium: 3.7 mmol/L (ref 3.5–5.1)
Sodium: 138 mmol/L (ref 135–145)
Total Bilirubin: 0.8 mg/dL (ref 0.3–1.2)
Total Protein: 7.1 g/dL (ref 6.5–8.1)

## 2023-01-27 LAB — TSH: TSH: 5.288 u[IU]/mL — ABNORMAL HIGH (ref 0.350–4.500)

## 2023-01-27 LAB — TROPONIN I (HIGH SENSITIVITY)
Troponin I (High Sensitivity): 18 ng/L — ABNORMAL HIGH (ref <18)
Troponin I (High Sensitivity): 20 ng/L — ABNORMAL HIGH (ref <18)

## 2023-01-27 LAB — MAGNESIUM: Magnesium: 2.7 mg/dL — ABNORMAL HIGH (ref 1.7–2.4)

## 2023-01-27 LAB — T4, FREE: Free T4: 1.07 ng/dL (ref 0.61–1.12)

## 2023-01-27 MED ORDER — DILTIAZEM HCL ER COATED BEADS 180 MG PO CP24: 180.0000 mg | ORAL_CAPSULE | Freq: Every day | ORAL | 0 refills | Status: DC

## 2023-01-27 MED ORDER — METOPROLOL TARTRATE 5 MG/5ML IV SOLN
5.0000 mg | Freq: Once | INTRAVENOUS | Status: AC
  Administered 2023-01-27: 5 mg via INTRAVENOUS
  Filled 2023-01-27: qty 5

## 2023-01-27 MED ORDER — APIXABAN 5 MG PO TABS: 5.0000 mg | ORAL_TABLET | Freq: Two times a day (BID) | ORAL | 0 refills | Status: DC

## 2023-01-27 NOTE — Discharge Instructions (Addendum)
You were seen in the emergency department today for your palpitations.  Your EKG did show that you are in atrial fibrillation.  Please follow-up with your cardiologist for further evaluation.  In the interim, I do recommend starting extended release diltiazem daily as well as Eliquis.  Return to the ER for any new or worsening symptoms.

## 2023-01-27 NOTE — ED Triage Notes (Addendum)
Pt to ED via POV c/o irregular heart beat. Pt has had this happen before. Came here for same in 2020 and converted with cardizem. Pt occassionaly would feel his heart beat irregularly but tonight it did not stop. Pt checked pulse at home and read 150. Denies CP, SOB, fevers, dizziness. Took cardizem PTA.

## 2023-01-27 NOTE — ED Provider Notes (Signed)
[facility] Provider Note    Event Date/Time   First MD Initiated Contact with Patient 01/27/23 0240     (approximate)   History   Irregular Heart Beat   HPI  Raymond Vazquez. is a 77 y.o. male with history of atrial fibrillation not on anticoagulation presenting to the emergency department for evaluation of palpitations.  Around midnight, patient noticed onset of palpitations.  He has a Kardia mobile device that he used that reported possible A-fib.  He has a prescription for as needed diltiazem for palpitations.  He reports he took a total of 180 mg of this, but after a few hours continue to have palpitations leading to ER presentation.  He feels similar to prior A-fib episodes.  No chest pain or shortness of breath.  No recent fevers or chills.  No lightheadedness or dizziness.     Physical Exam   Triage Vital Signs: ED Triage Vitals  Encounter Vitals Group     BP 01/27/23 0245 129/76     Systolic BP Percentile --      Diastolic BP Percentile --      Pulse Rate 01/27/23 0245 95     Resp 01/27/23 0245 13     Temp --      Temp src --      SpO2 01/27/23 0245 96 %     Weight --      Height --      Head Circumference --      Peak Flow --      Pain Score 01/27/23 0243 0     Pain Loc --      Pain Education --      Exclude from Growth Chart --     Most recent vital signs: Vitals:   01/27/23 0430 01/27/23 0500  BP: 108/66 107/72  Pulse: 65 (!) 51  Resp: 11 15  SpO2: 97% 95%     General: Awake, interactive  CV:  Irregularly irregular rhythm with variable rate from the 90s to 130s Resp:  Lungs clear, unlabored respirations.  Abd:  Soft, nondistended.  Neuro:  Symmetric facial movement, fluid speech   ED Results / Procedures / Treatments   Labs (all labs ordered are listed, but only abnormal results are displayed) Labs Reviewed  COMPREHENSIVE METABOLIC PANEL - Abnormal; Notable for the following components:      Result Value    Glucose, Bld 107 (*)    All other components within normal limits  CBC WITH DIFFERENTIAL/PLATELET - Abnormal; Notable for the following components:   Lymphs Abs 4.3 (*)    All other components within normal limits  MAGNESIUM - Abnormal; Notable for the following components:   Magnesium 2.7 (*)    All other components within normal limits  TSH - Abnormal; Notable for the following components:   TSH 5.288 (*)    All other components within normal limits  TROPONIN I (HIGH SENSITIVITY) - Abnormal; Notable for the following components:   Troponin I (High Sensitivity) 18 (*)    All other components within normal limits  TROPONIN I (HIGH SENSITIVITY) - Abnormal; Notable for the following components:   Troponin I (High Sensitivity) 20 (*)    All other components within normal limits  T4, FREE     EKG EKG independently reviewed interpreted by myself (ER attending) demonstrates:  EKG demonstrates A-fib at a rate of 106, QRS 89, QTc 477, no acute ST changes noted  RADIOLOGY Imaging independently reviewed and  interpreted by myself demonstrates:    PROCEDURES:  Critical Care performed: No  Procedures   MEDICATIONS ORDERED IN ED: Medications  metoprolol tartrate (LOPRESSOR) injection 5 mg (5 mg Intravenous Given 01/27/23 0251)     IMPRESSION / MDM / ASSESSMENT AND PLAN / ED COURSE  I reviewed the triage vital signs and the nursing notes.  Differential diagnosis includes, but is not limited to, A-fib secondary to anemia, electrolyte abnormality, thyroid dysfunction  Patient's presentation is most consistent with acute presentation with potential threat to life or bodily function.  77 year old male presenting to the emergency department for evaluation of atrial fibrillation.  Known history of similar, but not on anticoagulation or routine medication for this.  Took diltiazem at home, will trial a dose of IV Lopressor.  After receiving IV Lopressor, patient unfortunately did have  significant decrease in his blood pressure and heart rate down to the 40s with systolics in the 70s.  Did become lightheaded during this time, but did not syncopized.  He was given a liter of IV fluid with improvement in his blood pressure and heart rate.  These did continue to uptrend and remained stable without recurrent hypotension.  Labs without severe derangement.  TSH was elevated, but normal free T4.  Slightly elevated troponin, stable on repeat, strongly suspect related to arrhythmia.  I discussed possible admission as the patient did remain in A-fib, but he now remains rate controlled with stabilized vital signs.  Patient did prefer discharge and do think this is reasonable given his improved vital signs and ability to follow-up closely as an outpatient.  CHA2DS2-VASc score of at least 2, patient would like to be started on anticoagulation.  Will also start him on extended release diltiazem and recommend taking this daily until he is able to see the cardiologist.  Strict return precautions provided.  Patient discharged in stable condition.    FINAL CLINICAL IMPRESSION(S) / ED DIAGNOSES   Final diagnoses:  Atrial fibrillation with rapid ventricular response (HCC)     Rx / DC Orders   ED Discharge Orders          Ordered    Ambulatory referral to Cardiology       Comments: If you have not heard from the Cardiology office within the next 72 hours please call [phone].   01/27/23 0511    diltiazem (CARDIZEM CD) 180 MG 24 hr capsule  Daily        01/27/23 0513    apixaban (ELIQUIS) 5 MG TABS tablet  2 times daily        01/27/23 6578             Note:  This document was prepared using Dragon voice recognition software and may include unintentional dictation errors.   Trinna Post, MD 01/27/23 [phone]

## 2023-02-23 ENCOUNTER — Inpatient Hospital Stay: Payer: Medicare Other

## 2023-02-23 ENCOUNTER — Inpatient Hospital Stay: Payer: Medicare Other | Attending: Oncology | Admitting: Licensed Clinical Social Worker

## 2023-02-23 ENCOUNTER — Encounter: Payer: Self-pay | Admitting: Licensed Clinical Social Worker

## 2023-02-23 DIAGNOSIS — Z808 Family history of malignant neoplasm of other organs or systems: Secondary | ICD-10-CM

## 2023-02-23 DIAGNOSIS — Z803 Family history of malignant neoplasm of breast: Secondary | ICD-10-CM | POA: Diagnosis not present

## 2023-02-23 DIAGNOSIS — Z8042 Family history of malignant neoplasm of prostate: Secondary | ICD-10-CM

## 2023-02-23 DIAGNOSIS — Z86010 Personal history of colonic polyps: Secondary | ICD-10-CM

## 2023-02-23 DIAGNOSIS — Z807 Family history of other malignant neoplasms of lymphoid, hematopoietic and related tissues: Secondary | ICD-10-CM

## 2023-02-23 NOTE — Progress Notes (Signed)
REFERRING PROVIDER: Theadore Nan, NP [address] [place],  [place] [address]  PRIMARY PROVIDER:  Lauro Regulus, MD  PRIMARY REASON FOR VISIT:  1. Personal history of colonic polyps   2. Family history of prostate cancer   3. Family history of breast cancer   4. Family history of brain cancer   5. Family history of lymphoma      HISTORY OF PRESENT ILLNESS:   Raymond Vazquez, a 77 y.o. male, was seen for a [place] cancer genetics consultation at the request of Dr. Arvilla Market due to a personal history of colon polyps and family history of cancer.  Raymond Vazquez presents to clinic today to discuss the possibility of a hereditary predisposition to cancer, genetic testing, and to further clarify his future cancer risks, as well as potential cancer risks for family members.   CANCER HISTORY:   Raymond Vazquez is a 77 y.o. male with no personal history of cancer.   He reports having 5-6 total colonoscopies. The 3 most recent are available for review in Epic, totaling 9 polyps. He reports more polyps on the other colonoscopies, for cumulative total of 10-20 polyps.  Past Medical History:  Diagnosis Date   A-fib [facility])    Amblyopia, right eye    Complication of anesthesia    episode of bigeminy during colonoscopy   History of colon polyps    Hypertrophy (benign) of prostate    Intussusception [facility])    Pneumonia     Past Surgical History:  Procedure Laterality Date   COLON SURGERY     COLONOSCOPY  2006, 2008, 2011, 2012   COLONOSCOPY WITH PROPOFOL N/A 09/05/2015   Procedure: COLONOSCOPY WITH PROPOFOL;  Surgeon: Scot Jun, MD;  Location: [facility] ENDOSCOPY;  Service: Endoscopy;  Laterality: N/A;   COLONOSCOPY WITH PROPOFOL N/A 01/26/2019   Procedure: COLONOSCOPY WITH PROPOFOL;  Surgeon: Christena Deem, MD;  Location: [facility] ENDOSCOPY;  Service: Endoscopy;  Laterality: N/A;   KNEE ARTHROSCOPY WITH LATERAL MENISECTOMY Right 06/18/2020   Procedure: RIGHT KNEE ARTHROSCOPY WITH  DEBRIDEMENT AND REPAIR VERSUS PARTIAL LATERAL MENISCECTOMY.;  Surgeon: Christena Flake, MD;  Location: [facility] ORS;  Service: Orthopedics;  Laterality: Right;   SHOULDER ARTHROSCOPY WITH ROTATOR CUFF REPAIR AND OPEN BICEPS TENODESIS Right 06/21/2019   Procedure: SHOULDER ARTHROSCOPY DEBRIDEMENT, DECOMPRESSION, AND REPAIR OF MASSIVE  ROTATOR CUFF TEAR WITH BICEPS TENODESIS.;  Surgeon: Christena Flake, MD;  Location: [facility] ORS;  Service: Orthopedics;  Laterality: Right;   SMALL BOWEL REPAIR  age 18   WISDOM TOOTH EXTRACTION      FAMILY HISTORY:  We obtained a detailed, 4-generation family history.  Significant diagnoses are listed below: Family History  Problem Relation Age of Onset   Diabetes Mother    Prostate cancer Father    Alzheimer's disease Father    Brain cancer Maternal Aunt        d. 30s   Breast cancer Paternal Aunt    Prostate cancer Paternal Uncle    Prostate cancer Maternal Grandfather    Lymphoma Cousin        follicular dx 99   Other Cousin        acoustic neuroma   Raymond Vazquez has 1 son, 64. He has not had colonoscopy yet.  Raymond Vazquez's mother died at 16. Maternal aunt died of brain cancer in her 55s, and she had a daughter who had an acoustic neuroma.   Raymond Vazquez's father had prostate cancer but did not pass of it.  He passed at 65. Paternal uncle had prostate cancer and may have passed of metastatic disease or a different cancer, he had cancer in a lymph node but primary was not determined. A paternal aunt had breast cancer and passed of metastatic disease at 63. Paternal grandfather died or prostate cancer.  Raymond Vazquez is unaware of previous family history of genetic testing for hereditary cancer risks. There is no reported Ashkenazi Jewish ancestry. There is no known consanguinity.    GENETIC COUNSELING ASSESSMENT: Raymond Vazquez is a 77 y.o. male with a personal history of colon polyps and family history of prostate cancer which is somewhat suggestive of a hereditary  cancer syndrome and predisposition to cancer. We, therefore, discussed and recommended the following at today's visit.   DISCUSSION: We discussed that polyps in general are common, however, most people have fewer than 5 lifetime polyps.  When an individual has 10 or more polyps we become concerned about an underlying polyposis syndrome.  The most common hereditary polyposis syndromes are caused by problems in the APC and MUTYH genes. We discussed that approximately 10% of prostate cancer is hereditary. Most cases of hereditary prostate cancer are associated with BRCA1/BRCA2 genes, although there are other genes associated with hereditary cancer as well. Cancers and risks are gene specific. We discussed that testing is beneficial for several reasons including knowing about cancer risks, identifying potential screening and risk-reduction options that may be appropriate, and to understand if other family members could be at risk for cancer and allow them to undergo genetic testing.   We reviewed the characteristics, features and inheritance patterns of hereditary cancer syndromes. We also discussed genetic testing, including the appropriate family members to test, the process of testing, insurance coverage and turn-around-time for results. We discussed the implications of a negative, positive and/or variant of uncertain significant result. We recommended Raymond Vazquez pursue genetic testing for the [facility] Multi-Cancer+RNA gene panel.   Based on Raymond Vazquez personal history of polyps and family history of cancer he meets medical criteria for genetic testing. Despite that he meets criteria, he may still have an out of pocket cost.   PLAN: After considering the risks, benefits, and limitations, Raymond Vazquez provided informed consent to pursue genetic testing and the blood sample was sent to [facility] for analysis of the Multi-Cancer+RNA panel. Results should be available within approximately 2-3 weeks'  time, at which point they will be disclosed by telephone to Raymond Vazquez, as will any additional recommendations warranted by these results. Raymond Vazquez will receive a summary of his genetic counseling visit and a copy of his results once available. This information will also be available in Epic.   Raymond Vazquez questions were answered to his satisfaction today. Our contact information was provided should additional questions or concerns arise. Thank you for the referral and allowing [place] to share in the care of your patient.   Lacy Duverney, MS, [facility] Genetic Counselor [email or web address] Phone: [phone]  The patient was seen for a total of 25 minutes in face-to-face genetic counseling.  Dr. Blake Divine was available for discussion regarding this case.   _______________________________________________________________________ For Office Staff:  Number of people involved in session: 1 Was an Intern/ student involved with case: no

## 2023-03-04 ENCOUNTER — Telehealth: Payer: Self-pay | Admitting: Licensed Clinical Social Worker

## 2023-03-04 ENCOUNTER — Encounter: Payer: Self-pay | Admitting: Licensed Clinical Social Worker

## 2023-03-04 ENCOUNTER — Ambulatory Visit: Payer: Self-pay | Admitting: Licensed Clinical Social Worker

## 2023-03-04 DIAGNOSIS — Z1379 Encounter for other screening for genetic and chromosomal anomalies: Secondary | ICD-10-CM | POA: Insufficient documentation

## 2023-03-04 NOTE — Telephone Encounter (Signed)
I contacted Mr. Mczeal to discuss his genetic testing results. No pathogenic variants were identified in the 70 genes analyzed. Detailed clinic note to follow.   The test report has been scanned into EPIC and is located under the Molecular Pathology section of the Results Review tab.  A portion of the result report is included below for reference.      Lacy Duverney, MS, [facility] Genetic Counselor [email or web address] Phone: [phone]

## 2023-03-04 NOTE — Progress Notes (Signed)
HPI:   Raymond Vazquez was previously seen in the [place] [facility] clinic due to a personal history of colon polyps and family history of cancer and concerns regarding a hereditary predisposition to cancer. Please refer to our prior cancer genetics clinic note for more information regarding our discussion, assessment and recommendations, at the time. Raymond Vazquez recent genetic test results were disclosed to him, as were recommendations warranted by these results. These results and recommendations are discussed in more detail below.  CANCER HISTORY:  Oncology History   No history exists.    FAMILY HISTORY:  We obtained a detailed, 4-generation family history.  Significant diagnoses are listed below: Family History  Problem Relation Age of Onset   Diabetes Mother    Prostate cancer Father    Alzheimer's disease Father    Brain cancer Maternal Aunt        d. 30s   Breast cancer Paternal Aunt    Prostate cancer Paternal Uncle    Prostate cancer Maternal Grandfather    Lymphoma Cousin        follicular dx 47   Other Cousin        acoustic neuroma   Raymond Vazquez has 1 son, 25. He has not had colonoscopy yet.   Raymond Vazquez's mother died at 17. Maternal aunt died of brain cancer in her 76s, and she had a daughter who had an acoustic neuroma.    Raymond Vazquez's father had prostate cancer but did not pass of it. He passed at 103. Paternal uncle had prostate cancer and may have passed of metastatic disease or a different cancer, he had cancer in a lymph node but primary was not determined. A paternal aunt had breast cancer and passed of metastatic disease at 5. Paternal grandfather died or prostate cancer.   Raymond Vazquez is unaware of previous family history of genetic testing for hereditary cancer risks. There is no reported Ashkenazi Jewish ancestry. There is no known consanguinity.      GENETIC TEST RESULTS:  The [facility] Multi-Cancer+RNA Panel found no pathogenic mutations.   The  Multi-Cancer + RNA Panel offered by [facility] includes sequencing and/or deletion/duplication analysis of the following 70 genes:  AIP*, ALK, APC*, ATM*, AXIN2*, BAP1*, BARD1*, BLM*, BMPR1A*, BRCA1*, BRCA2*, BRIP1*, CDC73*, CDH1*, CDK4, CDKN1B*, CDKN2A, CHEK2*, CTNNA1*, DICER1*, EPCAM, EGFR, FH*, FLCN*, GREM1, HOXB13, KIT, LZTR1, MAX*, MBD4, MEN1*, MET, MITF, MLH1*, MSH2*, MSH3*, MSH6*, MUTYH*, NF1*, NF2*, NTHL1*, PALB2*, PDGFRA, PMS2*, POLD1*, POLE*, POT1*, PRKAR1A*, PTCH1*, PTEN*, RAD51C*, RAD51D*, RB1*, RET, SDHA*, SDHAF2*, SDHB*, SDHC*, SDHD*, SMAD4*, SMARCA4*, SMARCB1*, SMARCE1*, STK11*, SUFU*, TMEM127*, TP53*, TSC1*, TSC2*, VHL*. RNA analysis is performed for * genes.   The test report has been scanned into EPIC and is located under the Molecular Pathology section of the Results Review tab.  A portion of the result report is included below for reference. Genetic testing reported out on 03/02/2023.    Even though a pathogenic variant was not identified, possible explanations for the cancer in the family may include: There may be no hereditary risk for cancer in the family. The cancers in Raymond Vazquez and/or his family may be sporadic/familial or due to other genetic and environmental factors. There may be a gene mutation in one of these genes that current testing methods cannot detect but that chance is small. There could be another gene that has not yet been discovered, or that we have not yet tested, that is responsible for the cancer diagnoses in the family.  It is also  possible there is a hereditary cause for the cancer in the family that Raymond Vazquez did not inherit.  Therefore, it is important to remain in touch with cancer genetics in the future so that we can continue to offer Raymond Vazquez the most up to date genetic testing.    ADDITIONAL GENETIC TESTING:  We discussed with Raymond Vazquez that his genetic testing was fairly extensive.  If there are additional relevant genes identified to increase  cancer risk that can be analyzed in the future, we would be happy to discuss and coordinate this testing at that time.    CANCER SCREENING RECOMMENDATIONS:  Raymond Vazquez test result is considered negative (normal).  This means that we have not identified a hereditary cause for his family history of cancer or personal history of colon polyps at this time.   An individual's cancer risk and medical management are not determined by genetic test results alone. Overall cancer risk assessment incorporates additional factors, including personal medical history, family history, and any available genetic information that may result in a personalized plan for cancer prevention and surveillance. Therefore, it is recommended he continue to follow the cancer management and screening guidelines provided by his  primary healthcare provider.  RECOMMENDATIONS FOR FAMILY MEMBERS:   Since he did not inherit a identifiable mutation in a cancer predisposition gene included on this panel, his children could not have inherited a known mutation from him in one of these genes. Individuals in this family might be at some increased risk of developing cancer, over the general population risk, due to the family history of cancer.  Individuals in the family should notify their providers of the family history of cancer. We recommend women in this family have a yearly mammogram beginning at age 27, or 8 years younger than the earliest onset of cancer, an annual clinical breast exam, and perform monthly breast self-exams.  Family members should have colonoscopies by at age 58, or earlier, as recommended by their providers. Other members of the family may still carry a pathogenic variant in one of these genes that Raymond Vazquez did not inherit. Based on the family history, we recommend his paternal relatives have genetic counseling and testing. Raymond Vazquez will let us know if we can be of any assistance in coordinating genetic counseling  and/or testing for this family member.    FOLLOW-UP:  Lastly, we discussed with Raymond Vazquez that cancer genetics is a rapidly advancing field and it is possible that new genetic tests will be appropriate for him and/or his family members in the future. We encouraged him to remain in contact with cancer genetics on an annual basis so we can update his personal and family histories and let him know of advances in cancer genetics that may benefit this family.   Our contact number was provided. Raymond Vazquez questions were answered to his satisfaction, and he knows he is welcome to call us at anytime with additional questions or concerns.    Lacy Duverney, MS, [facility] Genetic Counselor [email or web address] Phone: [phone]

## 2023-04-28 ENCOUNTER — Encounter: Payer: Self-pay | Admitting: Gastroenterology

## 2023-05-12 ENCOUNTER — Encounter: Payer: Self-pay | Admitting: Gastroenterology

## 2023-05-12 NOTE — Progress Notes (Signed)
Called pt for pre call for procedure next week. Pt stated that pharmacy does not have his prescription. Called Gi office spoke with Victorino Dike who stated that prescription has been called into pharmacy Tuesday but would still leave a message for the nurse. Pt was notified by this nurse that a nurse from the office would follow up.

## 2023-05-19 ENCOUNTER — Ambulatory Visit: Payer: Medicare Other | Admitting: Registered Nurse

## 2023-05-19 ENCOUNTER — Ambulatory Visit
Admission: RE | Admit: 2023-05-19 | Discharge: 2023-05-19 | Disposition: A | Discharge status: Home / Self Care | Payer: Medicare Other | Attending: Gastroenterology | Admitting: Gastroenterology

## 2023-05-19 ENCOUNTER — Other Ambulatory Visit: Payer: Self-pay

## 2023-05-19 ENCOUNTER — Encounter: Payer: Self-pay | Admitting: Gastroenterology

## 2023-05-19 ENCOUNTER — Encounter
Admission: RE | Disposition: A | Discharge status: Home / Self Care | Payer: Self-pay | Source: Home / Self Care | Attending: Gastroenterology

## 2023-05-19 DIAGNOSIS — I4891 Unspecified atrial fibrillation: Secondary | ICD-10-CM | POA: Insufficient documentation

## 2023-05-19 DIAGNOSIS — K5730 Diverticulosis of large intestine without perforation or abscess without bleeding: Secondary | ICD-10-CM | POA: Insufficient documentation

## 2023-05-19 DIAGNOSIS — Z1211 Encounter for screening for malignant neoplasm of colon: Secondary | ICD-10-CM | POA: Insufficient documentation

## 2023-05-19 DIAGNOSIS — D124 Benign neoplasm of descending colon: Secondary | ICD-10-CM | POA: Diagnosis not present

## 2023-05-19 DIAGNOSIS — D123 Benign neoplasm of transverse colon: Secondary | ICD-10-CM | POA: Insufficient documentation

## 2023-05-19 DIAGNOSIS — D122 Benign neoplasm of ascending colon: Secondary | ICD-10-CM | POA: Diagnosis not present

## 2023-05-19 DIAGNOSIS — I10 Essential (primary) hypertension: Secondary | ICD-10-CM | POA: Insufficient documentation

## 2023-05-19 DIAGNOSIS — K640 First degree hemorrhoids: Secondary | ICD-10-CM | POA: Insufficient documentation

## 2023-05-19 DIAGNOSIS — Z7901 Long term (current) use of anticoagulants: Secondary | ICD-10-CM | POA: Diagnosis not present

## 2023-05-19 DIAGNOSIS — Z79899 Other long term (current) drug therapy: Secondary | ICD-10-CM | POA: Diagnosis not present

## 2023-05-19 HISTORY — DX: Unspecified osteoarthritis, unspecified site: M19.90

## 2023-05-19 HISTORY — DX: Other specified abnormal findings of blood chemistry: R79.89

## 2023-05-19 HISTORY — PX: POLYPECTOMY: SHX5525

## 2023-05-19 HISTORY — DX: Unilateral primary osteoarthritis, right knee: M17.11

## 2023-05-19 HISTORY — DX: Dizziness and giddiness: R42

## 2023-05-19 HISTORY — DX: Prediabetes: R73.03

## 2023-05-19 HISTORY — DX: Benign prostatic hyperplasia with lower urinary tract symptoms: N40.1

## 2023-05-19 HISTORY — PX: COLONOSCOPY WITH PROPOFOL: SHX5780

## 2023-05-19 HISTORY — DX: Essential (primary) hypertension: I10

## 2023-05-19 HISTORY — DX: Patellofemoral disorders, right knee: M22.2X1

## 2023-05-19 HISTORY — DX: Benign prostatic hyperplasia with lower urinary tract symptoms: N13.8

## 2023-05-19 SURGERY — COLONOSCOPY WITH PROPOFOL
Anesthesia: General

## 2023-05-19 MED ORDER — SODIUM CHLORIDE 0.9 % IV SOLN: INTRAVENOUS | Status: DC

## 2023-05-19 MED ORDER — PROPOFOL 1000 MG/100ML IV EMUL
INTRAVENOUS | Status: AC
  Filled 2023-05-19: qty 100

## 2023-05-19 MED ORDER — LIDOCAINE HCL (CARDIAC) PF 100 MG/5ML IV SOSY
PREFILLED_SYRINGE | INTRAVENOUS | Status: DC | PRN
  Administered 2023-05-19: 20 mg via INTRAVENOUS

## 2023-05-19 MED ORDER — PROPOFOL 500 MG/50ML IV EMUL
INTRAVENOUS | Status: DC | PRN
  Administered 2023-05-19: 125 ug/kg/min via INTRAVENOUS

## 2023-05-19 MED ORDER — PROPOFOL 10 MG/ML IV BOLUS
INTRAVENOUS | Status: DC | PRN
  Administered 2023-05-19: 20 mg via INTRAVENOUS
  Administered 2023-05-19: 30 mg via INTRAVENOUS
  Administered 2023-05-19: 50 mg via INTRAVENOUS
  Administered 2023-05-19: 30 mg via INTRAVENOUS

## 2023-05-19 NOTE — Anesthesia Preprocedure Evaluation (Signed)
Anesthesia Evaluation  Patient identified by MRN, date of birth, ID band Patient awake    Reviewed: Allergy & Precautions, H&P , NPO status , Patient's Chart, lab work & pertinent test results  History of Anesthesia Complications Negative for: history of anesthetic complications  Airway Mallampati: II       Dental no notable dental hx.    Pulmonary pneumonia, resolved   Pulmonary exam normal breath sounds clear to auscultation       Cardiovascular hypertension, (-) angina (-) Past MI and (-) Cardiac Stents Normal cardiovascular exam+ dysrhythmias Atrial Fibrillation (-) Valvular Problems/Murmurs Rhythm:Regular Rate:Normal  H/o Afib X1 - converted, was on anticoag for a month.  No further issues, followed appropriately   Neuro/Psych negative neurological ROS  negative psych ROS   GI/Hepatic negative GI ROS, Neg liver ROS,,,  Endo/Other  negative endocrine ROS    Renal/GU negative Renal ROS  negative genitourinary   Musculoskeletal negative musculoskeletal ROS (+)    Abdominal   Peds negative pediatric ROS (+)  Hematology negative hematology ROS (+)   Anesthesia Other Findings Past Medical History: No date: A-fib (HCC) No date: Amblyopia, right eye No date: Complication of anesthesia     Comment:  episode of bigeminy during colonoscopy No date: History of colon polyps No date: Hypertrophy (benign) of prostate No date: Intussusception (HCC) No date: Pneumonia   Reproductive/Obstetrics negative OB ROS                             Anesthesia Physical Anesthesia Plan  ASA: 2  Anesthesia Plan: General   Post-op Pain Management:    Induction: Intravenous  PONV Risk Score and Plan: 2 and Propofol infusion and TIVA  Airway Management Planned: Natural Airway and Nasal Cannula  Additional Equipment:   Intra-op Plan:   Post-operative Plan:   Informed Consent: I have reviewed the  patients History and Physical, chart, labs and discussed the procedure including the risks, benefits and alternatives for the proposed anesthesia with the patient or authorized representative who has indicated his/her understanding and acceptance.       Plan Discussed with: CRNA, Anesthesiologist and Surgeon  Anesthesia Plan Comments:         Anesthesia Quick Evaluation

## 2023-05-19 NOTE — H&P (Signed)
Pre-Procedure H&P   Patient ID: Raymond Heyer. is a 77 y.o. male.  Gastroenterology Provider: Jaynie Collins, DO  Referring Provider: Fransico Setters, NP PCP: Lauro Regulus, MD  Date: 05/19/2023  HPI Mr. Raymond Boik. is a 77 y.o. male who presents today for Colonoscopy for personal history colon polyps.  Last underwent colonoscopy 01/2019 2 adenomatous polyps, diverticulosis and internal hemorrhoids.  In March 2017 he had 6 adenomatous polyps.  In total he has had 15 since his first colonoscopy in 1999  Regular bowel movements without melena or hematochezia  History of partial small bowel resection for intussusception  No family history of colon cancer or colon polyps.  Creatinine 0.8 hemoglobin 16 MCV 85.7 platelets 206,000  No longer taking Eliquis   Past Medical History:  Diagnosis Date   A-fib (HCC)    Amblyopia, right eye    Arthritis    BPH with obstruction/lower urinary tract symptoms    Complication of anesthesia    episode of bigeminy during colonoscopy   History of colon polyps    Hypertension    Hypertrophy (benign) of prostate    Intussusception (HCC)    Low vitamin B12 level    Patellofemoral syndrome of right knee    Pneumonia    Pre-diabetes    Primary osteoarthritis of right knee    Vertigo     Past Surgical History:  Procedure Laterality Date   COLON SURGERY     COLONOSCOPY  2006, 2008, 2011, 2012   COLONOSCOPY WITH PROPOFOL N/A 09/05/2015   Procedure: COLONOSCOPY WITH PROPOFOL;  Surgeon: Scot Jun, MD;  Location: [facility] ENDOSCOPY;  Service: Endoscopy;  Laterality: N/A;   COLONOSCOPY WITH PROPOFOL N/A 01/26/2019   Procedure: COLONOSCOPY WITH PROPOFOL;  Surgeon: Christena Deem, MD;  Location: [facility] ENDOSCOPY;  Service: Endoscopy;  Laterality: N/A;   KNEE ARTHROSCOPY WITH LATERAL MENISECTOMY Right 06/18/2020   Procedure: RIGHT KNEE ARTHROSCOPY WITH DEBRIDEMENT AND REPAIR VERSUS PARTIAL LATERAL MENISCECTOMY.;   Surgeon: Christena Flake, MD;  Location: [facility] ORS;  Service: Orthopedics;  Laterality: Right;   SHOULDER ARTHROSCOPY WITH ROTATOR CUFF REPAIR AND OPEN BICEPS TENODESIS Right 06/21/2019   Procedure: SHOULDER ARTHROSCOPY DEBRIDEMENT, DECOMPRESSION, AND REPAIR OF MASSIVE  ROTATOR CUFF TEAR WITH BICEPS TENODESIS.;  Surgeon: Christena Flake, MD;  Location: [facility] ORS;  Service: Orthopedics;  Laterality: Right;   SMALL BOWEL REPAIR  age 53   WISDOM TOOTH EXTRACTION      Family History No h/o GI disease or malignancy  Review of Systems  Constitutional:  Negative for activity change, appetite change, chills, diaphoresis, fatigue, fever and unexpected weight change.  HENT:  Negative for trouble swallowing and voice change.   Respiratory:  Negative for shortness of breath and wheezing.   Cardiovascular:  Negative for chest pain, palpitations and leg swelling.  Gastrointestinal:  Negative for abdominal distention, abdominal pain, anal bleeding, blood in stool, constipation, diarrhea, nausea and vomiting.  Musculoskeletal:  Negative for arthralgias and myalgias.  Skin:  Negative for color change and pallor.  Neurological:  Negative for dizziness, syncope and weakness.  Psychiatric/Behavioral:  Negative for confusion. The patient is not nervous/anxious.   All other systems reviewed and are negative.    Medications No current facility-administered medications on file prior to encounter.   Current Outpatient Medications on File Prior to Encounter  Medication Sig Dispense Refill   cyanocobalamin 1000 MCG tablet Take 1,000 mcg by mouth daily.     Multiple Vitamin (MULTIVITAMIN) tablet  Take 1 tablet by mouth daily.     apixaban (ELIQUIS) 5 MG TABS tablet Take 1 tablet (5 mg total) by mouth 2 (two) times daily. (Patient not taking: Reported on 05/19/2023) 60 tablet 0   diltiazem (CARDIZEM CD) 180 MG 24 hr capsule Take 1 capsule (180 mg total) by mouth daily. 30 capsule 0   dutasteride (AVODART) 0.5 MG capsule  Take 1 capsule (0.5 mg total) by mouth at bedtime. 90 capsule 3   tamsulosin (FLOMAX) 0.4 MG CAPS capsule Take 1 capsule (0.4 mg total) by mouth at bedtime. 90 capsule 3    Pertinent medications related to GI and procedure were reviewed by me with the patient prior to the procedure   Current Facility-Administered Medications:    0.9 %  sodium chloride infusion, , Intravenous, Continuous, Jaynie Collins, DO  sodium chloride         No Known Allergies Allergies were reviewed by me prior to the procedure  Objective   Body mass index is 24.63 kg/m. Vitals:   05/19/23 0901  BP: (!) 165/82  Pulse: 66  Resp: 16  Temp: (!) 96.2 F (35.7 C)  TempSrc: Temporal  SpO2: 98%  Weight: 73.5 kg  Height: 5\' 8"  (1.727 m)     Physical Exam Vitals and nursing note reviewed.  Constitutional:      General: He is not in acute distress.    Appearance: Normal appearance. He is not ill-appearing, toxic-appearing or diaphoretic.  HENT:     Head: Normocephalic and atraumatic.     Nose: Nose normal.     Mouth/Throat:     Mouth: Mucous membranes are moist.     Pharynx: Oropharynx is clear.  Eyes:     General: No scleral icterus.    Extraocular Movements: Extraocular movements intact.  Cardiovascular:     Rate and Rhythm: Normal rate and regular rhythm.     Heart sounds: Normal heart sounds. No murmur heard.    No friction rub. No gallop.  Pulmonary:     Effort: Pulmonary effort is normal. No respiratory distress.     Breath sounds: Normal breath sounds. No wheezing, rhonchi or rales.  Abdominal:     General: Bowel sounds are normal. There is no distension.     Palpations: Abdomen is soft.     Tenderness: There is no abdominal tenderness. There is no guarding or rebound.  Musculoskeletal:     Cervical back: Neck supple.     Right lower leg: No edema.     Left lower leg: No edema.  Skin:    General: Skin is warm and dry.     Coloration: Skin is not jaundiced or pale.   Neurological:     General: No focal deficit present.     Mental Status: He is alert and oriented to person, place, and time. Mental status is at baseline.  Psychiatric:        Mood and Affect: Mood normal.        Behavior: Behavior normal.        Thought Content: Thought content normal.        Judgment: Judgment normal.      Assessment:  Raymond Vazquez. is a 77 y.o. male  who presents today for Colonoscopy for personal history colon polyps.  Plan:  Colonoscopy with possible intervention today for personal history colon polyps with possible biopsy, control of bleeding, polypectomy, and interventions as necessary has been discussed with the patient/patient representative. Informed consent  was obtained from the patient/patient representative after explaining the indication, nature, and risks of the procedure including but not limited to death, bleeding, perforation, missed neoplasm/lesions, cardiorespiratory compromise, and reaction to medications. Opportunity for questions was given and appropriate answers were provided. Patient/patient representative has verbalized understanding is amenable to undergoing the procedure.   Jaynie Collins, DO  [facility]astroenter[facility]  Portions of the record may have been created with voice recognition software. Occasional wrong-word or 'sound-a-like' substitutions may have occurred due to the inherent limitations of voice recognition software.  Read the chart carefully and recognize, using context, where substitutions may have occurred.

## 2023-05-19 NOTE — Interval H&P Note (Signed)
History and Physical Interval Note: Preprocedure H&P from 05/19/23  was reviewed and there was no interval change after seeing and examining the patient.  Written consent was obtained from the patient after discussion of risks, benefits, and alternatives. Patient has consented to proceed with Colonoscopy with possible intervention   05/19/2023 9:21 AM  Raymond Vazquez.  has presented today for surgery, with the diagnosis of V12.72 (ICD-9-CM) - Z86.010 (ICD-10-CM) - Hx of adenomatous colonic polyps.  The various methods of treatment have been discussed with the patient and family. After consideration of risks, benefits and other options for treatment, the patient has consented to  Procedure(s): COLONOSCOPY WITH PROPOFOL (N/A) as a surgical intervention.  The patient's history has been reviewed, patient examined, no change in status, stable for surgery.  I have reviewed the patient's chart and labs.  Questions were answered to the patient's satisfaction.     Jaynie Collins

## 2023-05-19 NOTE — Transfer of Care (Signed)
Immediate Anesthesia Transfer of Care Note  Patient: Raymond Vazquez.  Procedure(s) Performed: COLONOSCOPY WITH PROPOFOL POLYPECTOMY  Patient Location: PACU and Endoscopy Unit  Anesthesia Type:General  Level of Consciousness: drowsy  Airway & Oxygen Therapy: Patient Spontanous Breathing  Post-op Assessment: Report given to RN and Post -op Vital signs reviewed and stable  Post vital signs: Reviewed  Last Vitals:  Vitals Value Taken Time  BP 97/67 05/19/23 1013  Temp 35.8 C 05/19/23 1014  Pulse 70 05/19/23 1014  Resp 14 05/19/23 1014  SpO2 97 % 05/19/23 1014  Vitals shown include unfiled device data.  Last Pain:  Vitals:   05/19/23 1014  TempSrc: Temporal  PainSc:          Complications: No notable events documented.

## 2023-05-19 NOTE — Op Note (Signed)
[facility] Gastroenterology Patient Name: Raymond Vazquez Procedure Date: 05/19/2023 9:29 AM MRN: [id number] Account #: [id number] Date of Birth: 10-Oct-1945 Admit Type: Outpatient Age: 78 Room: [facility] ENDO ROOM 1 Gender: Male Note Status: Finalized Instrument Name: Peds Colonoscope [id number] Procedure:             Colonoscopy Indications:           High risk colon cancer surveillance: Personal history                         of colonic polyps Providers:             Trenda Moots, DO Referring MD:          Marya Amsler. Dareen Piano MD, MD (Referring MD) Medicines:             Monitored Anesthesia Care Complications:         No immediate complications. Estimated blood loss:                         Minimal. Procedure:             Pre-Anesthesia Assessment:                        - Prior to the procedure, a History and Physical was                         performed, and patient medications and allergies were                         reviewed. The patient is competent. The risks and                         benefits of the procedure and the sedation options and                         risks were discussed with the patient. All questions                         were answered and informed consent was obtained.                         Patient identification and proposed procedure were                         verified by the physician, the nurse, the anesthetist                         and the technician in the endoscopy suite. Mental                         Status Examination: alert and oriented. Airway                         Examination: normal oropharyngeal airway and neck                         mobility. Respiratory Examination: clear to  auscultation. CV Examination: RRR, no murmurs, no S3                         or S4. Prophylactic Antibiotics: The patient does not                         require prophylactic antibiotics. Prior                          Anticoagulants: The patient has taken no anticoagulant                         or antiplatelet agents. ASA Grade Assessment: II - A                         patient with mild systemic disease. After reviewing                         the risks and benefits, the patient was deemed in                         satisfactory condition to undergo the procedure. The                         anesthesia plan was to use monitored anesthesia care                         (MAC). Immediately prior to administration of                         medications, the patient was re-assessed for adequacy                         to receive sedatives. The heart rate, respiratory                         rate, oxygen saturations, blood pressure, adequacy of                         pulmonary ventilation, and response to care were                         monitored throughout the procedure. The physical                         status of the patient was re-assessed after the                         procedure.                        After obtaining informed consent, the colonoscope was                         passed under direct vision. Throughout the procedure,                         the patient's blood pressure, pulse, and oxygen  saturations were monitored continuously. The                         Colonoscope was introduced through the anus and                         advanced to the the terminal ileum, with                         identification of the appendiceal orifice and IC                         valve. The colonoscopy was somewhat difficult due to a                         redundant colon and significant looping. Successful                         completion of the procedure was aided by straightening                         and shortening the scope to obtain bowel loop                         reduction, using scope torsion, applying abdominal                         pressure and  lavage. The patient tolerated the                         procedure well. The quality of the bowel preparation                         was evaluated using the BBPS [facility] Bowel Preparation                         Scale) with scores of: Right Colon = 3 (entire mucosa                         seen well with no residual staining, small fragments                         of stool or opaque liquid), Transverse Colon = 3                         (entire mucosa seen well with no residual staining,                         small fragments of stool or opaque liquid) and Left                         Colon = 2 (minor amount of residual staining, small                         fragments of stool and/or opaque liquid, but mucosa  seen well). The total BBPS score equals 8. The quality                         of the bowel preparation was excellent. The terminal                         ileum, ileocecal valve, appendiceal orifice, and                         rectum were photographed. Findings:      The perianal and digital rectal examinations were normal. Pertinent       negatives include normal sphincter tone.      The terminal ileum appeared normal. Estimated blood loss: none.      Retroflexion in the right colon was performed.      Four sessile polyps were found in the descending colon (1), transverse       colon (1) and ascending colon (2). The polyps were 1 to 2 mm in size.       These polyps were removed with a jumbo cold forceps. Resection and       retrieval were complete. Estimated blood loss was minimal.      Multiple small-mouthed diverticula were found in the entire colon.       Estimated blood loss: none.      A tattoo was seen in the distal ascending colon. The tattoo site       appeared normal. Estimated blood loss: none.      Non-bleeding internal hemorrhoids were found during retroflexion. The       hemorrhoids were Grade I (internal hemorrhoids that do not prolapse).        Estimated blood loss: none.      The exam was otherwise without abnormality on direct and retroflexion       views. Impression:            - The examined portion of the ileum was normal.                        - Four 1 to 2 mm polyps in the descending colon, in                         the transverse colon and in the ascending colon,                         removed with a jumbo cold forceps. Resected and                         retrieved.                        - Diverticulosis in the entire examined colon.                        - A tattoo was seen in the distal ascending colon. The                         tattoo site appeared normal.                        - Non-bleeding internal hemorrhoids.                        -  The examination was otherwise normal on direct and                         retroflexion views. Recommendation:        - Patient has a contact number available for                         emergencies. The signs and symptoms of potential                         delayed complications were discussed with the patient.                         Return to normal activities tomorrow. Written                         discharge instructions were provided to the patient.                        - Discharge patient to home.                        - Resume previous diet.                        - Continue present medications.                        - Await pathology results.                        - Repeat colonoscopy for surveillance based on                         pathology results.                        - Return to referring physician as previously                         scheduled.                        - The findings and recommendations were discussed with                         the patient. Procedure Code(s):     --- Professional ---                        [phone], Colonoscopy, flexible; with biopsy, single or                         multiple Diagnosis Code(s):     ---  Professional ---                        Z86.010, Personal history of colonic polyps                        K64.0, First degree hemorrhoids  D12.4, Benign neoplasm of descending colon                        D12.3, Benign neoplasm of transverse colon (hepatic                         flexure or splenic flexure)                        D12.2, Benign neoplasm of ascending colon                        K57.30, Diverticulosis of large intestine without                         perforation or abscess without bleeding CPT copyright 2022 [employer]. All rights reserved. The codes documented in this report are preliminary and upon coder review may  be revised to meet current compliance requirements. Attending Participation:      I personally performed the entire procedure. Elfredia Nevins, DO Jaynie Collins DO, DO 05/19/2023 10:14:57 AM This report has been signed electronically. Number of Addenda: 0 Note Initiated On: 05/19/2023 9:29 AM Scope Withdrawal Time: 0 hours 16 minutes 20 seconds  Total Procedure Duration: 0 hours 32 minutes 19 seconds  Estimated Blood Loss:  Estimated blood loss was minimal.      [facility]

## 2023-05-20 ENCOUNTER — Encounter: Payer: Self-pay | Admitting: Gastroenterology

## 2023-05-20 LAB — SURGICAL PATHOLOGY

## 2023-05-26 NOTE — Anesthesia Postprocedure Evaluation (Signed)
Anesthesia Post Note  Patient: Raymond Vazquez.  Procedure(s) Performed: COLONOSCOPY WITH PROPOFOL POLYPECTOMY  Patient location during evaluation: Endoscopy Anesthesia Type: General Level of consciousness: awake and alert Pain management: pain level controlled Vital Signs Assessment: post-procedure vital signs reviewed and stable Respiratory status: spontaneous breathing, nonlabored ventilation, respiratory function stable and patient connected to nasal cannula oxygen Cardiovascular status: blood pressure returned to baseline and stable Postop Assessment: no apparent nausea or vomiting Anesthetic complications: no   No notable events documented.   Last Vitals:  Vitals:   05/19/23 1024 05/19/23 1034  BP: 121/79 (!) 152/89  Pulse: (!) 52 63  Resp: 19 14  Temp:    SpO2: 99% 100%    Last Pain:  Vitals:   05/19/23 1034  TempSrc:   PainSc: 0-No pain                 Lenard Simmer

## 2023-06-14 ENCOUNTER — Other Ambulatory Visit: Payer: Self-pay | Admitting: Surgery

## 2023-06-14 DIAGNOSIS — M7582 Other shoulder lesions, left shoulder: Secondary | ICD-10-CM

## 2023-06-14 DIAGNOSIS — M25512 Pain in left shoulder: Secondary | ICD-10-CM

## 2023-06-15 ENCOUNTER — Encounter: Payer: Self-pay | Admitting: Surgery

## 2023-06-21 ENCOUNTER — Ambulatory Visit
Admission: RE | Admit: 2023-06-21 | Discharge: 2023-06-21 | Disposition: A | Discharge status: Home / Self Care | Payer: Medicare Other | Source: Ambulatory Visit | Attending: Surgery

## 2023-06-21 DIAGNOSIS — M25512 Pain in left shoulder: Secondary | ICD-10-CM

## 2023-06-21 DIAGNOSIS — M7582 Other shoulder lesions, left shoulder: Secondary | ICD-10-CM

## 2023-08-03 ENCOUNTER — Other Ambulatory Visit: Payer: Self-pay | Admitting: Surgery

## 2023-08-09 ENCOUNTER — Other Ambulatory Visit: Payer: Self-pay

## 2023-08-09 ENCOUNTER — Encounter
Admission: RE | Admit: 2023-08-09 | Discharge: 2023-08-09 | Disposition: A | Discharge status: Home / Self Care | Payer: Medicare Other | Source: Ambulatory Visit | Attending: Surgery | Admitting: Surgery

## 2023-08-09 DIAGNOSIS — Z01812 Encounter for preprocedural laboratory examination: Secondary | ICD-10-CM | POA: Diagnosis present

## 2023-08-09 DIAGNOSIS — Z79899 Other long term (current) drug therapy: Secondary | ICD-10-CM | POA: Insufficient documentation

## 2023-08-09 HISTORY — DX: Other shoulder lesions, left shoulder: M75.82

## 2023-08-09 HISTORY — DX: Sleep apnea, unspecified: G47.30

## 2023-08-09 HISTORY — DX: Complete rotator cuff tear or rupture of left shoulder, not specified as traumatic: M75.122

## 2023-08-09 HISTORY — DX: Bicipital tendinitis, left shoulder: M75.22

## 2023-08-09 HISTORY — DX: Palpitations: R00.2

## 2023-08-09 LAB — COMPREHENSIVE METABOLIC PANEL
ALT: 22 U/L (ref 0–44)
AST: 23 U/L (ref 15–41)
Albumin: 4.3 g/dL (ref 3.5–5.0)
Alkaline Phosphatase: 44 U/L (ref 38–126)
Anion gap: 10 (ref 5–15)
BUN: 26 mg/dL — ABNORMAL HIGH (ref 8–23)
CO2: 25 mmol/L (ref 22–32)
Calcium: 9.1 mg/dL (ref 8.9–10.3)
Chloride: 102 mmol/L (ref 98–111)
Creatinine, Ser: 0.89 mg/dL (ref 0.61–1.24)
GFR, Estimated: >60 mL/min (ref >60)
Glucose, Bld: 112 mg/dL — ABNORMAL HIGH (ref 70–99)
Potassium: 3.9 mmol/L (ref 3.5–5.1)
Sodium: 137 mmol/L (ref 135–145)
Total Bilirubin: 1.2 mg/dL (ref 0.0–1.2)
Total Protein: 6.4 g/dL — ABNORMAL LOW (ref 6.5–8.1)

## 2023-08-09 LAB — CBC WITH DIFFERENTIAL/PLATELET
Abs Immature Granulocytes: 0.02 10*3/uL (ref 0.00–0.07)
Basophils Absolute: 0 10*3/uL (ref 0.0–0.1)
Basophils Relative: 0 %
Eosinophils Absolute: 0.4 10*3/uL (ref 0.0–0.5)
Eosinophils Relative: 5 %
HCT: 42.6 % (ref 39.0–52.0)
Hemoglobin: 14.4 g/dL (ref 13.0–17.0)
Immature Granulocytes: 0 %
Lymphocytes Relative: 39 %
Lymphs Abs: 3.2 10*3/uL (ref 0.7–4.0)
MCH: 28.5 pg (ref 26.0–34.0)
MCHC: 33.8 g/dL (ref 30.0–36.0)
MCV: 84.4 fL (ref 80.0–100.0)
Monocytes Absolute: 0.6 10*3/uL (ref 0.1–1.0)
Monocytes Relative: 8 %
Neutro Abs: 3.8 10*3/uL (ref 1.7–7.7)
Neutrophils Relative %: 48 %
Platelets: 216 10*3/uL (ref 150–400)
RBC: 5.05 MIL/uL (ref 4.22–5.81)
RDW: 12.8 % (ref 11.5–15.5)
WBC: 8.1 10*3/uL (ref 4.0–10.5)
nRBC: 0 % (ref 0.0–0.2)

## 2023-08-09 LAB — URINALYSIS, ROUTINE W REFLEX MICROSCOPIC
Bilirubin Urine: NEGATIVE
Glucose, UA: NEGATIVE mg/dL
Hgb urine dipstick: NEGATIVE
Ketones, ur: NEGATIVE mg/dL
Leukocytes,Ua: NEGATIVE
Nitrite: NEGATIVE
Protein, ur: NEGATIVE mg/dL
Specific Gravity, Urine: 1.025 (ref 1.005–1.030)
pH: 5 (ref 5.0–8.0)

## 2023-08-09 NOTE — Patient Instructions (Addendum)
 Your procedure is scheduled on: 08/16/2023 Tuesday Report to the Registration Desk on the 1st floor of the [facility]. To find out your arrival time, please call [phone] between 1PM - 3PM on:  Monday, 08/15/2023  If your arrival time is 6:00 am, do not arrive before that time as the [facility] entrance doors do not open until 6:00 am.  REMEMBER: Instructions that are not followed completely may result in serious medical risk, up to and including death; or upon the discretion of your surgeon and anesthesiologist your surgery may need to be rescheduled.  Do not eat food after midnight the night before surgery.  No gum chewing or hard candies.  You may however, drink CLEAR liquids up to 2 hours before you are scheduled to arrive for your surgery. Do not drink anything within 2 hours of your scheduled arrival time.  Clear liquids include: - water  - apple juice without pulp - gatorade (not RED colors) - black coffee or tea (Do NOT add milk or creamers to the coffee or tea) Do NOT drink anything that is not on this list.    In addition, your doctor has ordered for you to drink the provided:  Ensure Pre-Surgery Clear Carbohydrate Drink   Drinking this carbohydrate drink up to two hours before surgery helps to reduce insulin resistance and improve patient outcomes. Please complete drinking 2 hours before scheduled arrival time.  One week prior to surgery: Stop Anti-inflammatories (NSAIDS) such as Advil, Aleve, Ibuprofen, Motrin, Naproxen, Naprosyn and Aspirin based products such as Excedrin, Goody's Powder, BC Powder. Stop ANY OVER THE COUNTER supplements until after surgery like multivitamin and B12  You may however, continue to take Tylenol if needed for pain up until the day of surgery.     Continue taking all of your other prescription medications up until the day of surgery.  ON THE DAY OF SURGERY ONLY TAKE THESE MEDICATIONS WITH SIPS OF WATER:  CARTIA XT  diltiazem  (CARDIZEM CD)- as needed metoprolol succinate  tamsulosin (FLOMAX)    No Alcohol for 24 hours before or after surgery.  No Smoking including e-cigarettes for 24 hours before surgery.  No chewable tobacco products for at least 6 hours before surgery.  No nicotine patches on the day of surgery.  Do not use any "recreational" drugs for at least a week (preferably 2 weeks) before your surgery.  Please be advised that the combination of cocaine and anesthesia may have negative outcomes, up to and including death. If you test positive for cocaine, your surgery will be cancelled.  On the morning of surgery brush your teeth with toothpaste and water, you may rinse your mouth with mouthwash if you wish. Do not swallow any toothpaste or mouthwash.  Use CHG Soap or wipes as directed on instruction sheet.-provided for you  Do not wear jewelry, make-up, hairpins, clips or nail polish.  Do not wear lotions, powders, or perfumes.   Do not shave body hair from the neck down 48 hours before surgery.  Contact lenses, hearing aids and dentures may not be worn into surgery.  Do not bring valuables to the hospital. [facility] is not responsible for any missing/lost belongings or valuables.    Notify your doctor if there is any change in your medical condition (cold, fever, infection).  Wear comfortable clothing (specific to your surgery type) to the hospital.  After surgery, you can help prevent lung complications by doing breathing exercises.  Take deep breaths and cough every  1-2 hours. Your doctor may order a device called an Incentive Spirometer to help you take deep breaths.  If you are being discharged the day of surgery, you will not be allowed to drive home. You will need a responsible individual to drive you home and stay with you for 24 hours after surgery.    Please call the Pre-admissions Testing Dept. at [phone] if you have any questions about these instructions.  Surgery  Visitation Policy:  Patients having surgery or a procedure may have two visitors.  Children under the age of 88 must have an adult with them who is not the patient.  Temporary Visitor Restrictions Due to increasing cases of flu, RSV and COVID-19: Children ages 27 and under will not be able to visit patients in [facility] under most circumstances.        Preparing for Surgery with CHLORHEXIDINE GLUCONATE (CHG) Soap  Chlorhexidine Gluconate (CHG) Soap  o An antiseptic cleaner that kills germs and bonds with the skin to continue killing germs even after washing  o Used for showering the night before surgery and morning of surgery  Before surgery, you can play an important role by reducing the number of germs on your skin.  CHG (Chlorhexidine gluconate) soap is an antiseptic cleanser which kills germs and bonds with the skin to continue killing germs even after washing.  Please do not use if you have an allergy to CHG or antibacterial soaps. If your skin becomes reddened/irritated stop using the CHG.  1. Shower the NIGHT BEFORE SURGERY and the MORNING OF SURGERY with CHG soap.  2. If you choose to wash your hair, wash your hair first as usual with your normal shampoo.  3. After shampooing, rinse your hair and body thoroughly to remove the shampoo.  4. Use CHG as you would any other liquid soap. You can apply CHG directly to the skin and wash gently with a scrungie or a clean washcloth.  5. Apply the CHG soap to your body only from the neck down. Do not use on open wounds or open sores. Avoid contact with your eyes, ears, mouth, and genitals (private parts). Wash face and genitals (private parts) with your normal soap.  6. Wash thoroughly, paying special attention to the area where your surgery will be performed.  7. Thoroughly rinse your body with warm water.  8. Do not shower/wash with your normal soap after using and rinsing off the CHG soap.  9. Pat yourself dry  with a clean towel.  10. Wear clean pajamas to bed the night before surgery.  12. Place clean sheets on your bed the night of your first shower and do not sleep with pets.  13. Shower again with the CHG soap on the day of surgery prior to arriving at the hospital.  14. Do not apply any deodorants/lotions/powders.  15. Please wear clean clothes to the hospital.

## 2023-08-09 NOTE — Pre-Procedure Instructions (Signed)
 Per patient the MD discontinued the Eliquis due to no symptoms of A-fib anymore .

## 2023-08-12 ENCOUNTER — Encounter: Payer: Self-pay | Admitting: Surgery

## 2023-08-12 NOTE — Progress Notes (Signed)
 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 (Left: Shoulder)   Anesthesia type: Choice   Pre-op diagnosis:      Rotator cuff tendinitis, left M75.82     Nontraumatic complete tear of left rotator cuff M75.122     Tendinitis of upper biceps tendon of left shoulder M75.22   Location: [facility] OR ROOM 02 / [facility]   Surgeons: Christena Flake, MD      NOTE: Available PAT nursing documentation and vital signs have been reviewed. Clinical nursing staff has updated patient's PMH/PSHx, current medication list, and drug allergies/intolerances to ensure comprehensive history available to assist in medical decision making as it pertains to the aforementioned surgical procedure and anticipated anesthetic course. Extensive review of available clinical information personally performed. [place] PMH and PSHx updated with any diagnoses/procedures that  may have been inadvertently omitted during his intake with the pre-admission testing department's nursing staff.  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 Patient has never been a smoker in the past. Pertinent PMH includes: atrial fibrillation, diastolic dysfunction, palpitations, HTN, prediabetes, OSAH (awaiting DME for nocturnal PAP therapy), OA, LEFT rotator cuff tear, BPH.  Patient is followed by cardiology Melton Alar, MD). He was last seen in the cardiology clinic on 07/29/2023; notes reviewed. At the time of his clinic visit, patient doing  well overall from a cardiovascular perspective.  Patient having episodes of short-lived palpitations.  Patient denied any chest pain, shortness of breath, PND, orthopnea, significant peripheral edema, weakness, fatigue, vertiginous symptoms, or presyncope/syncope. Patient with a past medical history significant for cardiovascular diagnoses. Documented physical exam was grossly benign, providing no evidence of acute exacerbation and/or decompensation of the patient's known cardiovascular conditions.  Most recent TTE was performed on 02/16/2022 revealing a normal left ventricular systolic function with an EF of >55%.  There was mild LVH. Left ventricular diastolic Doppler parameters consistent with abnormal relaxation (G1DD).  IAS with no evidence of PFO was observed.  Right ventricle is mildly enlarged.  There was trivial mitral, mild aortic and tricuspid, and moderate pulmonary valve regurgitation.  RVSP = 26.8 mmHg. All transvalvular gradients were noted to be normal providing no evidence suggestive of valvular stenosis. Aorta normal in size with no evidence of ectasia or aneurysmal dilatation.  Patient with an atrial fibrillation diagnosis; CHA2DS2-VASc Score = 3 (age x 2, hypertension). His rate and rhythm are currently being maintained on oral diltiazem. Patient has been prescribed a DOAC medication (apixaban), however he has not yet to start this medication pending upcoming elective surgery.  Blood pressure reasonably controlled at 138/68 mmHg on currently prescribed CCB (diltiazem) monotherapy..  Patient is not taking any type of lipid-lowering therapies for ASCVD prevention.  Patient with prediabetes diagnosis that he is controlling with diet lifestyle modification.  Most recent HgbA1c was 5.8% when checked on 08/12/2022.  Patient was recently diagnosed with OSAH via PSG study.  He is awaiting DME delivery to begin nocturnal PAP therapy. Patient is able to complete all of his  ADL/IADLs without  cardiovascular limitation.  Per the DASI, patient is  able to achieve at least 4 METS of physical activity without experiencing any significant degree of angina/anginal equivalent symptoms.  Given his palpitations, patient was started on metoprolol succinate and the aforementioned apixaban.  No other changes were made to his medication regimen.  Patient to follow-up with outpatient cardiology in 3 months or sooner if needed.  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 (Left: Shoulder) on 08/16/2023 with Dr. Leron Croak, MD. Given patient's past medical history significant for cardiovascular diagnoses, presurgical cardiac clearance was sought by the PAT team. Per cardiology, "this patient is optimized for surgery and may proceed with the planned procedural course with a MODERATE risk of significant perioperative cardiovascular complications".  Patient prescribed daily anticoagulation therapy using a DOAC medication.  Medication being used in the setting of worsening atrial fibrillation.  With that said, cardiology has cleared patient to initiate therapy following his shoulder surgery.  Patient is aware that medication should not be started until cleared postoperatively by orthopedics.  Patient reports previous perioperative complications with anesthesia in the past.  Patient experienced (+) ventricular bigeminy during routine colonoscopy.  In review his EMR, it is noted that patient underwent a general anesthetic course here at [facility] (ASA II) in 05/2023 without documented complications.      08/09/2023    2:34 PM 08/09/2023    2:32 PM 08/09/2023   12:05 PM  Vitals with BMI  Height   5\' 8"   Weight 168 lbs  171 lbs  BMI 25.55  26.01  Systolic  131   Diastolic  61   Pulse  64    Providers/Specialists:  NOTE: Primary physician provider listed below.  Patient may have been seen by APP or partner within same practice.   PROVIDER ROLE / SPECIALTY LAST OV  Poggi, Excell Seltzer, MD Orthopedics (Surgeon) 08/03/2023  Lauro Regulus, MD Primary Care Provider 02/24/2023  Clotilde Dieter, DO Cardiology 08/03/2023  Ned Clines, MD Pulmonary Medicine 07/07/2023   Allergies:  No Known Allergies Current Home Medications:   No current facility-administered medications for this encounter.    CARTIA XT 240 MG 24 hr capsule   cyanocobalamin 1000 MCG tablet   dutasteride (AVODART) 0.5 MG capsule   metoprolol succinate (TOPROL-XL) 50 MG 24 hr tablet   Multiple Vitamin (MULTIVITAMIN) tablet   tamsulosin (FLOMAX) 0.4 MG CAPS capsule   apixaban (ELIQUIS) 5 MG TABS tablet   diltiazem (CARDIZEM CD) 180 MG 24 hr capsule   History:   Past Medical History:  Diagnosis Date   A-fib (HCC)    Adenomatous colon polyp    Amblyopia, right eye    Arthritis    BPH with obstruction/lower urinary tract symptoms    CMV (cytomegalovirus infection) (HCC)    Complication of anesthesia    a.) experienced arrythmia (ventricular bigeminy) during routine during colonoscopy   Diastolic dysfunction 02/16/2022   a.) TTE 02/16/2022: EF >55%, mild LVH, G1DD, 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, norm RVSF, triv MR, mild AR/TR, mod PR, RVSP 26.8 mmHg   History of colon polyps    Hypertension    Intussusception (HCC)    Low vitamin B12 level    Nontraumatic complete tear of left rotator cuff    On apixaban therapy    Palpitations    Patellofemoral syndrome of right knee    Pneumonia    Pre-diabetes    Primary osteoarthritis of right knee  Rotator cuff tendinitis, left    Sleep apnea    a.) PSH indicated (+) OSAH; PAP therapy recommended --> awaiting DME   Tendinitis of upper biceps tendon of left shoulder    Vertigo    Past Surgical History:  Procedure Laterality Date   COLON SURGERY     COLONOSCOPY  2006, 2008, 2011, 2012   COLONOSCOPY WITH PROPOFOL  N/A 09/05/2015   Procedure: COLONOSCOPY WITH PROPOFOL;  Surgeon: Scot Jun, MD;  Location: [facility] ENDOSCOPY;  Service: Endoscopy;  Laterality: N/A;   COLONOSCOPY WITH PROPOFOL N/A 01/26/2019   Procedure: COLONOSCOPY WITH PROPOFOL;  Surgeon: Christena Deem, MD;  Location: [facility] ENDOSCOPY;  Service: Endoscopy;  Laterality: N/A;   COLONOSCOPY WITH PROPOFOL N/A 05/19/2023   Procedure: COLONOSCOPY WITH PROPOFOL;  Surgeon: Jaynie Collins, DO;  Location: [facility] ENDOSCOPY;  Service: Gastroenterology;  Laterality: N/A;   KNEE ARTHROSCOPY WITH LATERAL MENISECTOMY Right 06/18/2020   Procedure: RIGHT KNEE ARTHROSCOPY WITH DEBRIDEMENT AND REPAIR VERSUS PARTIAL LATERAL MENISCECTOMY.;  Surgeon: Christena Flake, MD;  Location: [facility] ORS;  Service: Orthopedics;  Laterality: Right;   POLYPECTOMY  05/19/2023   Procedure: POLYPECTOMY;  Surgeon: Jaynie Collins, DO;  Location: [facility] ENDOSCOPY;  Service: Gastroenterology;;   SHOULDER ARTHROSCOPY WITH ROTATOR CUFF REPAIR AND OPEN BICEPS TENODESIS Right 06/21/2019   Procedure: SHOULDER ARTHROSCOPY DEBRIDEMENT, DECOMPRESSION, AND REPAIR OF MASSIVE  ROTATOR CUFF TEAR WITH BICEPS TENODESIS.;  Surgeon: Christena Flake, MD;  Location: [facility] ORS;  Service: Orthopedics;  Laterality: Right;   SMALL BOWEL REPAIR  age 64   WISDOM TOOTH EXTRACTION     Family History  Problem Relation Age of Onset   Diabetes Mother    Prostate cancer Father    Alzheimer's disease Father    Brain cancer Maternal Aunt        d. 30s   Breast cancer Paternal Aunt    Prostate cancer Paternal Uncle    Prostate cancer Maternal Grandfather    Lymphoma Cousin        follicular dx 40   Other Cousin        acoustic neuroma   Social History   Tobacco Use   Smoking status: Never   Smokeless tobacco: Never  Substance Use Topics   Alcohol use: Not Currently   Pertinent Clinical Results:  LABS:  Hospital Outpatient Visit on 08/09/2023  Component Date Value Ref Range Status   WBC  08/09/2023 8.1  4.0 - 10.5 K/uL Final   RBC 08/09/2023 5.05  4.22 - 5.81 MIL/uL Final   Hemoglobin 08/09/2023 14.4  13.0 - 17.0 g/dL Final   HCT 81/19/1478 42.6  39.0 - 52.0 % Final   MCV 08/09/2023 84.4  80.0 - 100.0 fL Final   MCH 08/09/2023 28.5  26.0 - 34.0 pg Final   MCHC 08/09/2023 33.8  30.0 - 36.0 g/dL Final   RDW 29/56/2130 12.8  11.5 - 15.5 % Final   Platelets 08/09/2023 216  150 - 400 K/uL Final   nRBC 08/09/2023 0.0  0.0 - 0.2 % Final   Neutrophils Relative % 08/09/2023 48  % Final   Neutro Abs 08/09/2023 3.8  1.7 - 7.7 K/uL Final   Lymphocytes Relative 08/09/2023 39  % Final   Lymphs Abs 08/09/2023 3.2  0.7 - 4.0 K/uL Final   Monocytes Relative 08/09/2023 8  % Final   Monocytes Absolute 08/09/2023 0.6  0.1 - 1.0 K/uL Final   Eosinophils Relative 08/09/2023 5  % Final   Eosinophils Absolute 08/09/2023  0.4  0.0 - 0.5 K/uL Final   Basophils Relative 08/09/2023 0  % Final   Basophils Absolute 08/09/2023 0.0  0.0 - 0.1 K/uL Final   Immature Granulocytes 08/09/2023 0  % Final   Abs Immature Granulocytes 08/09/2023 0.02  0.00 - 0.07 K/uL Final   Performed at [facility], [address] [place], [place] [address]   Sodium 08/09/2023 137  135 - 145 mmol/L Final   Potassium 08/09/2023 3.9  3.5 - 5.1 mmol/L Final   Chloride 08/09/2023 102  98 - 111 mmol/L Final   CO2 08/09/2023 25  22 - 32 mmol/L Final   Glucose, Bld 08/09/2023 112 (H)  70 - 99 mg/dL Final   Glucose reference range applies only to samples taken after fasting for at least 8 hours.   BUN 08/09/2023 26 (H)  8 - 23 mg/dL Final   Creatinine, Ser 08/09/2023 0.89  0.61 - 1.24 mg/dL Final   Calcium 86/57/8469 9.1  8.9 - 10.3 mg/dL Final   Total Protein 62/95/2841 6.4 (L)  6.5 - 8.1 g/dL Final   Albumin 32/44/0102 4.3  3.5 - 5.0 g/dL Final   AST 72/53/6644 23  15 - 41 U/L Final   ALT 08/09/2023 22  0 - 44 U/L Final   Alkaline Phosphatase 08/09/2023 44  38 - 126 U/L Final   Total Bilirubin 08/09/2023 1.2  0.0  - 1.2 mg/dL Final   GFR, Estimated 08/09/2023 >60  >60 mL/min Final   Comment: (NOTE) Calculated using the CKD-EPI Creatinine Equation (2021)    Anion gap 08/09/2023 10  5 - 15 Final   Performed at [facility], [address] [place], [place] [address]   Color, Urine 08/09/2023 YELLOW (A)  YELLOW Final   APPearance 08/09/2023 CLEAR (A)  CLEAR Final   Specific Gravity, Urine 08/09/2023 1.025  1.005 - 1.030 Final   pH 08/09/2023 5.0  5.0 - 8.0 Final   Glucose, UA 08/09/2023 NEGATIVE  NEGATIVE mg/dL Final   Hgb urine dipstick 08/09/2023 NEGATIVE  NEGATIVE Final   Bilirubin Urine 08/09/2023 NEGATIVE  NEGATIVE Final   Ketones, ur 08/09/2023 NEGATIVE  NEGATIVE mg/dL Final   Protein, ur 25/95/6387 NEGATIVE  NEGATIVE mg/dL Final   Nitrite 56/43/3295 NEGATIVE  NEGATIVE Final   Leukocytes,Ua 08/09/2023 NEGATIVE  NEGATIVE Final   Performed at [facility], [address] [place], [place] [address]    ECG: Date: 01/27/2023  Time ECG obtained: 0245 AM Rate: 106 bpm Rhythm: atrial fibrillation Axis (leads I and aVF): normal Intervals: QRS 89 ms. QTc 477 ms. ST segment and T wave changes: No evidence of acute T wave abnormalities or significant ST segment elevation or depression.  Evidence of a possible, age undetermined, prior infarct:  No Comparison: Previous tracing obtained on 01/23/2022 showed normal sinus rhythm at a rate of 96 bpm.  There was borderline criteria for an anterior infarct present.   IMAGING / PROCEDURES: MR SHOULDER LEFT WO CONTRAST performed on 06/21/2023 Complete full-thickness, full width tear of the supraspinatus tendon with 12 mm of retraction. Mild infraspinatus tendinosis. Moderate subscapularis tendinosis. Moderate tendinosis of the intra-articular portion of the long head of the biceps tendon.  TRANSTHORACIC ECHOCARDIOGRAM performed on 02/16/2022 Normal left ventricular systolic function with an EF of >55% Mild LVH Left ventricular  diastolic Doppler parameters consistent with abnormal relaxation (G1DD). 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 Available labs, pertinent testing, and imaging results were personally reviewed by me in preparation for upcoming operative/procedural course. [facility] medical record has been updated following extensive record review and patient interview with PAT staff.   This patient has been appropriately cleared by cardiology with an overall MODERATE risk of experiencing significant perioperative cardiovascular complications. Based on clinical review performed today (08/12/23), barring any significant acute changes in the patient's overall condition, it is anticipated that he will be able to proceed with the planned surgical intervention. Any acute changes in clinical condition may necessitate his procedure being postponed and/or cancelled. Patient will meet with anesthesia team (MD and/or CRNA) on the day of his procedure for preoperative evaluation/assessment. Questions regarding anesthetic course will be fielded at that time.   Pre-surgical instructions were reviewed with the patient during his PAT appointment, and questions were fielded to satisfaction by PAT clinical staff. He has been instructed on which medications that he will need to hold prior to surgery, as well as the ones that have been deemed safe/appropriate to take on the day of his procedure. As part of the general education provided by PAT, patient made aware both verbally and in writing, that he would need to abstain from the use of any illegal substances during his perioperative course. He was advised that failure to follow the provided instructions could necessitate case cancellation or result in  serious perioperative complications up to and including death. Patient encouraged to contact PAT and/or his surgeon's office to discuss any questions or concerns that may arise prior to surgery; verbalized understanding.   Quentin Mulling, MSN, APRN, FNP-C, CEN [facility]  Perioperative Services Nurse Practitioner Phone: [phone] Fax: [phone] 08/12/23 3:29 PM  NOTE: This note has been prepared using Dragon dictation software. Despite my best ability to proofread, there is always the potential that unintentional transcriptional errors may still occur from this process.

## 2023-08-15 MED ORDER — ORAL CARE MOUTH RINSE: 15.0000 mL | Freq: Once | OROMUCOSAL | Status: AC

## 2023-08-15 MED ORDER — CEFAZOLIN SODIUM-DEXTROSE 2-4 GM/100ML-% IV SOLN
2.0000 g | INTRAVENOUS | Status: AC
  Administered 2023-08-16: 2 g via INTRAVENOUS

## 2023-08-15 MED ORDER — CHLORHEXIDINE GLUCONATE 0.12 % MT SOLN
15.0000 mL | Freq: Once | OROMUCOSAL | Status: AC
  Administered 2023-08-16: 15 mL via OROMUCOSAL

## 2023-08-15 MED ORDER — LACTATED RINGERS IV SOLN: INTRAVENOUS | Status: DC

## 2023-08-16 ENCOUNTER — Ambulatory Visit: Payer: Self-pay | Admitting: Urgent Care

## 2023-08-16 ENCOUNTER — Encounter: Payer: Self-pay | Admitting: Surgery

## 2023-08-16 ENCOUNTER — Ambulatory Visit

## 2023-08-16 ENCOUNTER — Other Ambulatory Visit: Payer: Self-pay

## 2023-08-16 ENCOUNTER — Ambulatory Visit
Admission: RE | Admit: 2023-08-16 | Discharge: 2023-08-16 | Disposition: A | Discharge status: Home / Self Care | Payer: Medicare Other | Attending: Surgery | Admitting: Surgery

## 2023-08-16 ENCOUNTER — Encounter
Admission: RE | Disposition: A | Discharge status: Home / Self Care | Payer: Self-pay | Source: Home / Self Care | Attending: Surgery

## 2023-08-16 DIAGNOSIS — G4730 Sleep apnea, unspecified: Secondary | ICD-10-CM | POA: Insufficient documentation

## 2023-08-16 DIAGNOSIS — M25812 Other specified joint disorders, left shoulder: Secondary | ICD-10-CM | POA: Diagnosis not present

## 2023-08-16 DIAGNOSIS — M7522 Bicipital tendinitis, left shoulder: Secondary | ICD-10-CM | POA: Diagnosis not present

## 2023-08-16 DIAGNOSIS — I10 Essential (primary) hypertension: Secondary | ICD-10-CM | POA: Insufficient documentation

## 2023-08-16 DIAGNOSIS — I480 Paroxysmal atrial fibrillation: Secondary | ICD-10-CM | POA: Insufficient documentation

## 2023-08-16 DIAGNOSIS — M75122 Complete rotator cuff tear or rupture of left shoulder, not specified as traumatic: Secondary | ICD-10-CM | POA: Insufficient documentation

## 2023-08-16 HISTORY — DX: Long term (current) use of anticoagulants: Z79.01

## 2023-08-16 HISTORY — PX: SHOULDER ARTHROSCOPY WITH SUBACROMIAL DECOMPRESSION, ROTATOR CUFF REPAIR AND BICEP TENDON REPAIR: SHX5687

## 2023-08-16 HISTORY — DX: Cytomegaloviral disease, unspecified: B25.9

## 2023-08-16 HISTORY — DX: Benign neoplasm of colon, unspecified: D12.6

## 2023-08-16 SURGERY — SHOULDER ARTHROSCOPY WITH SUBACROMIAL DECOMPRESSION, ROTATOR CUFF REPAIR AND BICEP TENDON REPAIR
Anesthesia: General | Site: Shoulder | Laterality: Left

## 2023-08-16 MED ORDER — ACETAMINOPHEN 10 MG/ML IV SOLN
INTRAVENOUS | Status: DC | PRN
  Administered 2023-08-16: 1000 mg via INTRAVENOUS

## 2023-08-16 MED ORDER — LACTATED RINGERS IV SOLN
INTRAVENOUS | Status: DC | PRN
  Administered 2023-08-16: 2000 mL

## 2023-08-16 MED ORDER — SEVOFLURANE IN SOLN
RESPIRATORY_TRACT | Status: AC
  Filled 2023-08-16: qty 250

## 2023-08-16 MED ORDER — CHLORHEXIDINE GLUCONATE 0.12 % MT SOLN
OROMUCOSAL | Status: AC
  Filled 2023-08-16: qty 15

## 2023-08-16 MED ORDER — PHENYLEPHRINE HCL-NACL 20-0.9 MG/250ML-% IV SOLN
INTRAVENOUS | Status: AC
  Filled 2023-08-16: qty 250

## 2023-08-16 MED ORDER — BUPIVACAINE HCL (PF) 0.5 % IJ SOLN
INTRAMUSCULAR | Status: AC
  Filled 2023-08-16: qty 30

## 2023-08-16 MED ORDER — FENTANYL CITRATE (PF) 100 MCG/2ML IJ SOLN: 25.0000 ug | INTRAMUSCULAR | Status: DC | PRN

## 2023-08-16 MED ORDER — VASOPRESSIN 20 UNIT/ML IV SOLN
INTRAVENOUS | Status: DC | PRN
  Administered 2023-08-16: 2 [IU] via INTRAVENOUS
  Administered 2023-08-16: 1 [IU] via INTRAVENOUS

## 2023-08-16 MED ORDER — KETOROLAC TROMETHAMINE 15 MG/ML IJ SOLN
INTRAMUSCULAR | Status: AC
  Filled 2023-08-16: qty 1

## 2023-08-16 MED ORDER — FENTANYL CITRATE (PF) 100 MCG/2ML IJ SOLN
INTRAMUSCULAR | Status: DC | PRN
  Administered 2023-08-16: 50 ug via INTRAVENOUS
  Administered 2023-08-16: 25 ug via INTRAVENOUS

## 2023-08-16 MED ORDER — METOCLOPRAMIDE HCL 10 MG PO TABS: 5.0000 mg | ORAL_TABLET | Freq: Three times a day (TID) | ORAL | Status: DC | PRN

## 2023-08-16 MED ORDER — LACTATED RINGERS IV SOLN: INTRAVENOUS | Status: DC | PRN

## 2023-08-16 MED ORDER — DILTIAZEM HCL ER COATED BEADS 180 MG PO CP24: 180.0000 mg | ORAL_CAPSULE | ORAL | Status: DC | PRN

## 2023-08-16 MED ORDER — ROCURONIUM BROMIDE 100 MG/10ML IV SOLN
INTRAVENOUS | Status: DC | PRN
  Administered 2023-08-16: 50 mg via INTRAVENOUS

## 2023-08-16 MED ORDER — BUPIVACAINE-EPINEPHRINE 0.5% -1:200000 IJ SOLN
INTRAMUSCULAR | Status: DC | PRN
  Administered 2023-08-16: 30 mL

## 2023-08-16 MED ORDER — PHENYLEPHRINE HCL-NACL 20-0.9 MG/250ML-% IV SOLN
INTRAVENOUS | Status: DC | PRN
  Administered 2023-08-16: 25 ug/min via INTRAVENOUS

## 2023-08-16 MED ORDER — LIDOCAINE HCL (PF) 2 % IJ SOLN
INTRAMUSCULAR | Status: AC
  Filled 2023-08-16: qty 5

## 2023-08-16 MED ORDER — FENTANYL CITRATE PF 50 MCG/ML IJ SOSY
50.0000 ug | PREFILLED_SYRINGE | Freq: Once | INTRAMUSCULAR | Status: AC
  Administered 2023-08-16: 50 ug via INTRAVENOUS

## 2023-08-16 MED ORDER — MIDAZOLAM HCL 2 MG/2ML IJ SOLN
INTRAMUSCULAR | Status: AC
  Filled 2023-08-16: qty 2

## 2023-08-16 MED ORDER — METOCLOPRAMIDE HCL 5 MG/ML IJ SOLN: 5.0000 mg | Freq: Three times a day (TID) | INTRAMUSCULAR | Status: DC | PRN

## 2023-08-16 MED ORDER — BUPIVACAINE LIPOSOME 1.3 % IJ SUSP
INTRAMUSCULAR | Status: AC
  Filled 2023-08-16: qty 20

## 2023-08-16 MED ORDER — DROPERIDOL 2.5 MG/ML IJ SOLN: 0.6250 mg | Freq: Once | INTRAMUSCULAR | Status: DC | PRN

## 2023-08-16 MED ORDER — PROPOFOL 10 MG/ML IV BOLUS
INTRAVENOUS | Status: DC | PRN
  Administered 2023-08-16: 50 mg via INTRAVENOUS
  Administered 2023-08-16: 150 mg via INTRAVENOUS

## 2023-08-16 MED ORDER — ACETAMINOPHEN 325 MG PO TABS: 325.0000 mg | ORAL_TABLET | Freq: Four times a day (QID) | ORAL | Status: DC | PRN

## 2023-08-16 MED ORDER — FENTANYL CITRATE (PF) 100 MCG/2ML IJ SOLN
INTRAMUSCULAR | Status: AC
  Filled 2023-08-16: qty 2

## 2023-08-16 MED ORDER — ONDANSETRON HCL 4 MG/2ML IJ SOLN: 4.0000 mg | Freq: Four times a day (QID) | INTRAMUSCULAR | Status: DC | PRN

## 2023-08-16 MED ORDER — FENTANYL CITRATE PF 50 MCG/ML IJ SOSY
PREFILLED_SYRINGE | INTRAMUSCULAR | Status: AC
  Filled 2023-08-16: qty 1

## 2023-08-16 MED ORDER — LIDOCAINE HCL (PF) 1 % IJ SOLN
INTRAMUSCULAR | Status: DC | PRN
  Administered 2023-08-16: 2.5 mL via SUBCUTANEOUS

## 2023-08-16 MED ORDER — BUPIVACAINE LIPOSOME 1.3 % IJ SUSP
INTRAMUSCULAR | Status: DC | PRN
  Administered 2023-08-16: 20 mL via PERINEURAL

## 2023-08-16 MED ORDER — OXYCODONE HCL 5 MG PO TABS: 5.0000 mg | ORAL_TABLET | ORAL | Status: DC | PRN

## 2023-08-16 MED ORDER — GLYCOPYRROLATE 0.2 MG/ML IJ SOLN
INTRAMUSCULAR | Status: DC | PRN
  Administered 2023-08-16: .2 mg via INTRAVENOUS

## 2023-08-16 MED ORDER — CEFAZOLIN SODIUM-DEXTROSE 2-4 GM/100ML-% IV SOLN
INTRAVENOUS | Status: AC
  Filled 2023-08-16: qty 100

## 2023-08-16 MED ORDER — PROPOFOL 10 MG/ML IV BOLUS
INTRAVENOUS | Status: AC
  Filled 2023-08-16: qty 20

## 2023-08-16 MED ORDER — DEXTROSE-SODIUM CHLORIDE 5-0.9 % IV SOLN: INTRAVENOUS | Status: DC

## 2023-08-16 MED ORDER — EPINEPHRINE PF 1 MG/ML IJ SOLN
INTRAMUSCULAR | Status: AC
  Filled 2023-08-16: qty 2

## 2023-08-16 MED ORDER — LIDOCAINE HCL (CARDIAC) PF 100 MG/5ML IV SOSY
PREFILLED_SYRINGE | INTRAVENOUS | Status: DC | PRN
  Administered 2023-08-16: 40 mg via INTRAVENOUS

## 2023-08-16 MED ORDER — ONDANSETRON HCL 4 MG/2ML IJ SOLN
INTRAMUSCULAR | Status: DC | PRN
  Administered 2023-08-16: 4 mg via INTRAVENOUS

## 2023-08-16 MED ORDER — OXYCODONE HCL 5 MG PO TABS: 5.0000 mg | ORAL_TABLET | ORAL | 0 refills | Status: DC | PRN

## 2023-08-16 MED ORDER — SUGAMMADEX SODIUM 200 MG/2ML IV SOLN
INTRAVENOUS | Status: DC | PRN
  Administered 2023-08-16: 200 mg via INTRAVENOUS

## 2023-08-16 MED ORDER — ACETAMINOPHEN 10 MG/ML IV SOLN
INTRAVENOUS | Status: AC
  Filled 2023-08-16: qty 100

## 2023-08-16 MED ORDER — LIDOCAINE HCL (PF) 1 % IJ SOLN
INTRAMUSCULAR | Status: AC
  Filled 2023-08-16: qty 5

## 2023-08-16 MED ORDER — PHENYLEPHRINE 80 MCG/ML (10ML) SYRINGE FOR IV PUSH (FOR BLOOD PRESSURE SUPPORT)
PREFILLED_SYRINGE | INTRAVENOUS | Status: DC | PRN
  Administered 2023-08-16: 80 ug via INTRAVENOUS
  Administered 2023-08-16: 160 ug via INTRAVENOUS

## 2023-08-16 MED ORDER — EPHEDRINE SULFATE-NACL 50-0.9 MG/10ML-% IV SOSY
PREFILLED_SYRINGE | INTRAVENOUS | Status: DC | PRN
  Administered 2023-08-16: 10 mg via INTRAVENOUS
  Administered 2023-08-16 (×2): 5 mg via INTRAVENOUS

## 2023-08-16 MED ORDER — BUPIVACAINE HCL (PF) 0.5 % IJ SOLN
INTRAMUSCULAR | Status: DC | PRN
  Administered 2023-08-16: 10 mL via PERINEURAL

## 2023-08-16 MED ORDER — ONDANSETRON HCL 4 MG PO TABS: 4.0000 mg | ORAL_TABLET | Freq: Four times a day (QID) | ORAL | Status: DC | PRN

## 2023-08-16 MED ORDER — DEXAMETHASONE SODIUM PHOSPHATE 10 MG/ML IJ SOLN
INTRAMUSCULAR | Status: DC | PRN
  Administered 2023-08-16: 10 mg via INTRAVENOUS

## 2023-08-16 MED ORDER — BUPIVACAINE HCL (PF) 0.5 % IJ SOLN
INTRAMUSCULAR | Status: AC
  Filled 2023-08-16: qty 10

## 2023-08-16 MED ORDER — KETOROLAC TROMETHAMINE 15 MG/ML IJ SOLN
15.0000 mg | Freq: Once | INTRAMUSCULAR | Status: AC
  Administered 2023-08-16: 15 mg via INTRAVENOUS

## 2023-08-16 SURGICAL SUPPLY — 48 items
ANCHOR HEALICOIL REGEN 5.5 (Anchor) IMPLANT
ANCHOR JUGGERKNOT WTAP NDL 2.9 (Anchor) IMPLANT
ANCHOR QFIX 2.8 SUT MINI TAPE (Anchor) IMPLANT
BIT DRILL JUGRKNT W/NDL BIT2.9 (DRILL) ×1 IMPLANT
BLADE FULL RADIUS 3.5 (BLADE) ×1 IMPLANT
BUR ACROMIONIZER 4.0 (BURR) ×1 IMPLANT
CHLORAPREP W/TINT 26 (MISCELLANEOUS) ×1 IMPLANT
COOLER POLAR GLACIER W/PUMP (MISCELLANEOUS) IMPLANT
COVER MAYO STAND STRL (DRAPES) ×1 IMPLANT
DILATOR 5.5 THREADED HEALICOIL (MISCELLANEOUS) IMPLANT
DRILL JUGGERKNOT W/NDL BIT 2.9 (DRILL) ×1 IMPLANT
ELECT CAUTERY BLADE 6.4 (BLADE) ×1 IMPLANT
ELECT REM PT RETURN 9FT ADLT (ELECTROSURGICAL) ×1 IMPLANT
ELECTRODE REM PT RTRN 9FT ADLT (ELECTROSURGICAL) ×1 IMPLANT
GAUZE SPONGE 4X4 12PLY STRL (GAUZE/BANDAGES/DRESSINGS) ×1 IMPLANT
GAUZE XEROFORM 1X8 LF (GAUZE/BANDAGES/DRESSINGS) ×1 IMPLANT
GLOVE BIO SURGEON STRL SZ7.5 (GLOVE) ×2 IMPLANT
GLOVE BIO SURGEON STRL SZ8 (GLOVE) ×2 IMPLANT
GLOVE BIOGEL PI IND STRL 8 (GLOVE) ×1 IMPLANT
GLOVE INDICATOR 8.0 STRL GRN (GLOVE) ×1 IMPLANT
GOWN STRL REUS W/ TWL LRG LVL3 (GOWN DISPOSABLE) ×1 IMPLANT
GOWN STRL REUS W/ TWL XL LVL3 (GOWN DISPOSABLE) ×1 IMPLANT
GRASPER SUT 15 45D LOW PRO (SUTURE) IMPLANT
IV LR IRRIG 3000ML ARTHROMATIC (IV SOLUTION) ×2 IMPLANT
KIT CANNULA 8X76-LX IN CANNULA (CANNULA) ×1 IMPLANT
KIT SUTURE 2.8 Q-FIX DISP (MISCELLANEOUS) IMPLANT
MANIFOLD NEPTUNE II (INSTRUMENTS) ×1 IMPLANT
MASK FACE SPIDER DISP (MASK) ×1 IMPLANT
MAT ABSORB FLUID 56X50 GRAY (MISCELLANEOUS) ×1 IMPLANT
PACK ARTHROSCOPY SHOULDER (MISCELLANEOUS) ×1 IMPLANT
PAD ABD DERMACEA PRESS 5X9 (GAUZE/BANDAGES/DRESSINGS) ×2 IMPLANT
PAD WRAPON POLAR SHDR UNIV (MISCELLANEOUS) IMPLANT
PASSER SUT FIRSTPASS SELF (INSTRUMENTS) IMPLANT
SLING ARM LRG DEEP (SOFTGOODS) ×1 IMPLANT
SLING ULTRA II LG (MISCELLANEOUS) ×1 IMPLANT
SPONGE T-LAP 18X18 ~~LOC~~+RFID (SPONGE) ×1 IMPLANT
STAPLER SKIN PROX 35W (STAPLE) ×1 IMPLANT
STRAP SAFETY 5IN WIDE (MISCELLANEOUS) ×1 IMPLANT
SUT ETHIBOND 0 MO6 C/R (SUTURE) ×1 IMPLANT
SUT ULTRABRAID 2 COBRAID 38 (SUTURE) IMPLANT
SUT VIC AB 2-0 CT1 TAPERPNT 27 (SUTURE) ×2 IMPLANT
TAPE MICROFOAM 4IN (TAPE) ×1 IMPLANT
TRAP FLUID SMOKE EVACUATOR (MISCELLANEOUS) ×1 IMPLANT
TUBE SET DOUBLEFLO INFLOW (TUBING) ×1 IMPLANT
TUBING CONNECTING 10 (TUBING) ×1 IMPLANT
WAND WEREWOLF FLOW 90D (MISCELLANEOUS) ×1 IMPLANT
WATER STERILE IRR 500ML POUR (IV SOLUTION) ×1 IMPLANT
WRAPON POLAR PAD SHDR UNIV (MISCELLANEOUS) ×1 IMPLANT

## 2023-08-16 NOTE — H&P (Signed)
 History of Present Illness: Raymond Self. is a 78 y.o. male who presents today for his surgical history and physical for upcoming left shoulder arthroscopy with debridement, decompression, rotator cuff repair and biceps tenodesis. The patient denies any trauma or injury affecting left shoulder since his last evaluation with Dr. Joice Lofts. He reports no pain at today's visit but states that the pain can reach a 7 out of 10 in severity with particular activity such as reaching above his head and out to the side. The patient denies any numbness or ting into the left upper extremity at today's visit. The patient denies any personal history of heart attack or stroke. He does have a history of A-fib and is on Cardizem. The patient denies any personal history of asthma or COPD. Denies any personal history of diabetes or history of DVT.  Past Medical History: Amblyopia of right eye  Arrhythmia  Atrial fibrillation (CMS/HHS-HCC) 03/29/2019 (Resolved with IV diltiazem)  H/O cytomegalovirus infection  History of colon polyps  History of intussusception  Hx of adenomatous colonic polyps 12/22/2018  Hypertrophy (benign) of prostate  Paroxysmal A-fib (CMS/HHS-HCC)  S/P colonoscopy with polypectomy   Past Surgical History: SIGMOIDOSCOPY FLEXIBLE 04/27/1998  COLONOSCOPY 08/28/2010 (06/18/2009, 06/29/2006, 04/10/2005; Adenomatous Polyps: CBF 08/2015)  COLONOSCOPY 09/05/2015 (Adenomatous Polyps: CBF 08/2018) COLONOSCOPY 01/26/2019 (Tubular adenoma of the colon/Repeat 3 to 5 yrs/MUS)  Limited arthroscopic debridement, arthroscopic subacromial decompression, mini-open rotator cuff repair using a dermal allograft, and mini-open biceps tenodesis, right shoulder. Right 06/21/2019 (Dr. Joice Lofts)  Arthroscopic debridement with partial lateral meniscectomy, right knee. Right 06/18/2020 (Dr. Joice Lofts)  Colon @ [facility] 05/19/2023 (Tubular adenomas/PHx CP/Repeat 59yrs/SMR)  COLON SURGERY 1960 (respected 20 inches of small  intestine due to intussusuception)  RESECTION SMALL BOWEL   Past Family History: Diabetes Mother  Diabetes type II Mother  DECEASED  Glaucoma Mother  Hip fracture Mother  Osteoporosis (Thinning of bones) Mother  Prostate cancer Father  Alzheimer's disease Father  Prostate cancer Paternal Grandfather  Prostate cancer Paternal Uncle  Diabetes Maternal Grandmother  Peripheral Vascular Disease (PVD or blocked arteries in arms and legs) Maternal Grandmother  Had diabetes  Heart failure Paternal Grandmother  Coronary Artery Disease (Blocked arteries around heart) Maternal Grandfather  Myocardial Infarction (Heart attack) Maternal Grandfather   Medications: cyanocobalamin (VITAMIN B12) 1000 MCG tablet Take 1,000 mcg by mouth once daily  dilTIAZem (CARDIZEM CD) 240 MG CD capsule Take 1 capsule (240 mg total) by mouth once daily 90 capsule 3  dutasteride (AVODART) 0.5 mg capsule Take 0.5 mg by mouth once daily  metoprolol SUCCinate (TOPROL-XL) 50 MG XL tablet Take 1 tablet (50 mg total) by mouth once daily 30 tablet 11  multivit-min/FA/lycopen/lutein (CENTRUM SILVER MEN ORAL) Take 1 tablet by mouth once daily  tamsulosin (FLOMAX) 0.4 mg capsule Take 0.4 mg by mouth once daily. Take 30 minutes after same meal each day.   Allergies: No Known Allergies   Review of Systems:  A comprehensive 14 point ROS was performed, reviewed by me today, and the pertinent orthopaedic findings are documented in the HPI.  Physical Exam: BP 130/68  Ht 172.7 cm (5\' 8" )  Wt 77.6 kg (171 lb)  BMI 26.00 kg/m  General/Constitutional: The patient appears to be well-nourished, well-developed, and in no acute distress. Neuro/Psych: Normal mood and affect, oriented to person, place and time. Eyes: Non-icteric. Pupils are equal, round, and reactive to light, and exhibit synchronous movement. ENT: Unremarkable. Lymphatic: No palpable adenopathy. Respiratory: Lungs clear to auscultation, Normal chest excursion,  No wheezes, and Non-labored breathing Cardiovascular: Regular rate and rhythm. No murmurs. and No edema, swelling or tenderness, except as noted in detailed exam. Integumentary: No impressive skin lesions present, except as noted in detailed exam. Musculoskeletal: Unremarkable, except as noted in detailed exam.  Left shoulder exam: SKIN: Normal SWELLING: None WARMTH: None LYMPH NODES: No adenopathy palpable CREPITUS: None TENDERNESS: Mild tenderness over anterior and anterolateral aspects of the shoulder ROM (active):  Forward flexion: 160 degrees Abduction: 150 degrees Internal rotation: T12 ROM (passive):  Forward flexion: 165 degrees Abduction: 160 degrees  ER/IR at 90 abd: 85 degrees/50 degrees  He scribes mild-moderate pain with internal and external rotation at 90 degrees of abduction, as well as with forward flexion and abduction, especially as he moves through an arc of 90 to 120 degrees.  STRENGTH: Forward flexion: 4+/5 Abduction: 3+/5 External rotation: 5/5 Internal rotation: 5/5 Pain with RC testing: Moderate pain with resisted abduction  STABILITY: Normal  SPECIAL TESTS: Juanetta Gosling' test: Mild-moderately positive Speed's test: Mildly positive Capsulitis - pain w/ passive ER: No Crossed arm test: Mildly positive Crank: Not evaluated Anterior apprehension: Negative Posterior apprehension: Not evaluated  He remains neurovascularly intact to the right upper extremity.  Imaging:  A recent MRI scan of the left shoulder is available for review and has been reviewed by myself. By report, the study demonstrates evidence of a full-thickness tear involving the supraspinatus tendon with 12 mm of retraction. There is no evidence for any muscle atrophy or edema, but there is some tendinopathy involving both the subscapularis and infraspinatus tendons. There is moderate tendinopathy of the long head of the biceps tendon as well. The glenohumeral joint appears to be  well-maintained. However, there is some degenerative changes of the [facility] joint.   Impression: 1. Nontraumatic complete tear of left rotator cuff. 2. Tendinitis of upper biceps tendon of left shoulder. 3. Rotator cuff tendinitis, left.  Plan:  1. Treatment options were discussed today with the patient. 2. The patient is scheduled for a left show arthroscopy with debridement, decompression, rotator cuff repair and mini open biceps tenodesis. Surgery scheduled with Dr. Joice Lofts on 08/16/2023. 3. The patient was instructed on the risk and benefits of surgical intervention and wishes to proceed at this time. 4. This document will serve as a surgical history and physical for the patient. 5. The patient will follow-up per standard postop protocol. They can call the clinic they have any questions, new symptoms develop or symptoms worsen.  The procedure was discussed with the patient, as were the potential risks (including bleeding, infection, nerve and/or blood vessel injury, persistent or recurrent pain, failure of the repair, progression of arthritis, need for further surgery, blood clots, strokes, heart attacks and/or arhythmias, pneumonia, etc.) and benefits. The patient states his understanding and wishes to proceed.    H&P reviewed and patient re-examined. No changes.

## 2023-08-16 NOTE — Op Note (Signed)
 08/16/2023  4:42 PM  Patient:   Jeanmarie Plant., MD  Pre-Op Diagnosis:   Impingement/tendinopathy with full-thickness rotator cuff tear, left shoulder.  Post-Op Diagnosis:   Impingement/tendinopathy with full-thickness rotator cuff tear and biceps tendinopathy, left shoulder.  Procedure:   Extensive arthroscopic debridement, arthroscopic subacromial decompression, mini-open rotator cuff repair, and mini-open biceps tenodesis, left shoulder.  Anesthesia:   General endotracheal with interscalene block using Exparel placed preoperatively by the anesthesiologist.  Surgeon:   Maryagnes Amos, MD  Assistant:   Horris Latino, PA-C  Findings:   As above. There was a full-thickness tear involving the entire supraspinatus tendon and extending into the anterior portion of the infraspinatus tendon. The subscapularis and teres minor tendons were in satisfactory condition. There was moderate labral fraying superiorly and posterior superiorly without frank detachment from the glenoid rim. The biceps tendon demonstrated moderate "lip sticking" without partial or full-thickness tearing. The articular surfaces of the glenoid and humerus both were in satisfactory condition.  Complications:   None  Estimated blood loss:   15 cc  Fluids:   500 cc  Tourniquet time:   None  Drains:   None  Closure:   Staples      Brief clinical note:   The patient is a 78 year old male with a history of progressively worsening pain and weakness of his left shoulder. The patient's symptoms have progressed despite medications, activity modification, etc. The patient's history and examination are consistent with impingement/tendinopathy with a rotator cuff tear. These findings were confirmed by MRI scan. The patient presents at this time for definitive management of these shoulder symptoms.  Procedure:   The patient underwent placement of an interscalene block by the anesthesiologist in the preoperative holding area  before being brought into the operating room and lain in the supine position. The patient then underwent general endotracheal intubation and anesthesia before being repositioned in the beach chair position using the beach chair positioner. The left shoulder and upper extremity were prepped with ChloraPrep solution before being draped sterilely. Preoperative antibiotics were administered. A timeout was performed to confirm the proper surgical site before the expected portal sites and incision site were injected with 0.5% Sensorcaine with epinephrine.   A posterior portal was created and the glenohumeral joint thoroughly inspected with the findings as described above. An anterior portal was created using an outside-in technique. The labrum and rotator cuff were further probed, again confirming the above-noted findings. The areas of labral fraying were debrided back to stable margins, as were the torn edges of the rotator cuff tissue. In addition, areas of extensive synovitis anteriorly and superiorly were debrided back to stable margins using the full-radius resector. The ArthroCare wand was inserted and used to obtain hemostasis, as well as to "anneal" the labrum superiorly and anteriorly. The instruments were removed from the joint after suctioning the excess fluid.  The camera was repositioned through the posterior portal into the subacromial space. A separate lateral portal was created using an outside-in technique. The 3.5 mm full-radius resector was introduced and used to perform a subtotal bursectomy. The ArthroCare wand was inserted and used to remove the periosteal tissue off the undersurface of the anterior third of the acromion as well as to recess the coracoacromial ligament from its attachment along the anterior and lateral margins of the acromion. The 4.0 mm acromionizing bur was introduced and used to complete the decompression by removing the undersurface of the anterior third of the acromion. The  full radius resector was  reintroduced to remove any residual bony debris before the ArthroCare wand was reintroduced to obtain hemostasis. The instruments were then removed from the subacromial space after suctioning the excess fluid.  An approximately 4-5 cm incision was made over the anterolateral aspect of the shoulder beginning at the anterolateral corner of the acromion and extending distally in line with the bicipital groove. This incision was carried down through the subcutaneous tissues to expose the deltoid fascia. The raphae between the anterior and middle thirds was identified and this plane developed to provide access into the subacromial space. Additional bursal tissues were debrided sharply using Metzenbaum scissors. The rotator cuff tear was readily identified.   The bicipital groove was identified by palpation and opened for 1-1.5 cm. The biceps tendon stump was retrieved through this defect. The floor of the bicipital groove was roughened with a curet before a Biomet 2.9 mm JuggerKnot anchor was inserted. Both sets of sutures were passed through the biceps tendon and tied securely to effect the tenodesis. The bicipital sheath was reapproximated using two #0 Ethibond interrupted sutures, incorporating the biceps tendon to further reinforce the tenodesis.  The margins of the rotator cuff tear were debrided sharply with a #15 blade and the exposed greater tuberosity roughened with a rongeur. The tear was repaired using three [employer] 2.8 mm Q-Fix anchors. These sutures were then brought back laterally and secured using two Smith & Nephew Healicoil knotless RegeneSorb anchors to create a two-layer closure. An apparent watertight closure was obtained.  The wound was copiously irrigated with sterile saline solution before the deltoid raphae was reapproximated using 2-0 Vicryl interrupted sutures. The subcutaneous tissues were closed in two layers using 2-0 Vicryl interrupted sutures before  the skin was closed using staples. The portal sites also were closed using staples. A sterile bulky dressing was applied to the shoulder before the arm was placed into a shoulder immobilizer. A Polar Care device also was applied to the shoulder. The patient was then awakened, extubated, and returned to the recovery room in satisfactory condition after tolerating the procedure well.

## 2023-08-16 NOTE — Anesthesia Preprocedure Evaluation (Signed)
 Anesthesia Evaluation  Patient identified by MRN, date of birth, ID band Patient awake    Reviewed: Allergy & Precautions, H&P , NPO status , Patient's Chart, lab work & pertinent test results  History of Anesthesia Complications Negative for: history of anesthetic complications  Airway Mallampati: II       Dental no notable dental hx.    Pulmonary neg shortness of breath, sleep apnea , pneumonia, resolved, neg COPD, neg recent URI   Pulmonary exam normal breath sounds clear to auscultation       Cardiovascular hypertension, (-) angina (-) Past MI and (-) Cardiac Stents Normal cardiovascular exam+ dysrhythmias Atrial Fibrillation (-) Valvular Problems/Murmurs Rhythm:Regular Rate:Normal  H/o Afib X1 - converted, was on anticoag for a month.  No further issues, followed appropriately   Neuro/Psych negative neurological ROS  negative psych ROS   GI/Hepatic negative GI ROS, Neg liver ROS,,,  Endo/Other  negative endocrine ROS    Renal/GU negative Renal ROS  negative genitourinary   Musculoskeletal negative musculoskeletal ROS (+)    Abdominal   Peds negative pediatric ROS (+)  Hematology negative hematology ROS (+)   Anesthesia Other Findings Past Medical History: No date: A-fib (HCC) No date: Amblyopia, right eye No date: Complication of anesthesia     Comment:  episode of bigeminy during colonoscopy No date: History of colon polyps No date: Hypertrophy (benign) of prostate No date: Intussusception (HCC) No date: Pneumonia   Reproductive/Obstetrics negative OB ROS                             Anesthesia Physical Anesthesia Plan  ASA: 2  Anesthesia Plan: General   Post-op Pain Management: Regional block*   Induction: Intravenous  PONV Risk Score and Plan: 2 and Ondansetron, Dexamethasone and Treatment may vary due to age or medical condition  Airway Management Planned: Oral  ETT  Additional Equipment:   Intra-op Plan:   Post-operative Plan: Extubation in OR  Informed Consent: I have reviewed the patients History and Physical, chart, labs and discussed the procedure including the risks, benefits and alternatives for the proposed anesthesia with the patient or authorized representative who has indicated his/her understanding and acceptance.       Plan Discussed with: CRNA, Anesthesiologist and Surgeon  Anesthesia Plan Comments:         Anesthesia Quick Evaluation

## 2023-08-16 NOTE — Discharge Instructions (Addendum)
 Orthopedic discharge instructions: Keep dressing dry and intact.  May shower once changed on post-op day #4 (Saturday).  Cover staples with Band-Aids after drying off. Apply ice frequently to shoulder. Take ibuprofen 600-800 mg TID with meals for 3-5 days, then as necessary. Take oxycodone as prescribed when needed.  May supplement with ES Tylenol if necessary. Keep shoulder immobilizer on at all times except may remove for bathing purposes. Follow-up in 10-14 days or as scheduled.     POLAR CARE INFORMATION  MassAdvertisement.it  How to use Breg Polar Care [facility] Therapy System?  YouTube   ShippingScam.co.uk  OPERATING INSTRUCTIONS  Start the product With dry hands, connect the transformer to the electrical connection located on the top of the cooler. Next, plug the transformer into an appropriate electrical outlet. The unit will automatically start running at this point.  To stop the pump, disconnect electrical power.  Unplug to stop the product when not in use. Unplugging the Polar Care unit turns it off. Always unplug immediately after use. Never leave it plugged in while unattended. Remove pad.    FIRST ADD WATER TO FILL LINE, THEN ICE---Replace ice when existing ice is almost melted  1 Discuss Treatment with your Licensed Health Care Practitioner and Use Only as Prescribed 2 Apply Insulation Barrier & Cold Therapy Pad 3 Check for Moisture 4 Inspect Skin Regularly  Tips and Trouble Shooting Usage Tips 1. Use cubed or chunked ice for optimal performance. 2. It is recommended to drain the Pad between uses. To drain the pad, hold the Pad upright with the hose pointed toward the ground. Depress the black plunger and allow water to drain out. 3. You may disconnect the Pad from the unit without removing the pad from the affected area by depressing the silver tabs on the hose coupling and gently pulling the hoses apart. The Pad and unit will seal itself and  will not leak. Note: Some dripping during release is normal. 4. DO NOT RUN PUMP WITHOUT WATER! The pump in this unit is designed to run with water. Running the unit without water will cause permanent damage to the pump. 5. Unplug unit before removing lid.  TROUBLESHOOTING GUIDE Pump not running, Water not flowing to the pad, Pad is not getting cold 1. Make sure the transformer is plugged into the wall outlet. 2. Confirm that the ice and water are filled to the indicated levels. 3. Make sure there are no kinks in the pad. 4. Gently pull on the blue tube to make sure the tube/pad junction is straight. 5. Remove the pad from the treatment site and ll it while the pad is lying at; then reapply. 6. Confirm that the pad couplings are securely attached to the unit. Listen for the double clicks (Figure 1) to confirm the pad couplings are securely attached.  Leaks    Note: Some condensation on the lines, controller, and pads is unavoidable, especially in warmer climates. 1. If using a Breg Polar Care Cold Therapy unit with a detachable Cold Therapy Pad, and a leak exists (other than condensation on the lines) disconnect the pad couplings. Make sure the silver tabs on the couplings are depressed before reconnecting the pad to the pump hose; then confirm both sides of the coupling are properly clicked in. 2. If the coupling continues to leak or a leak is detected in the pad itself, stop using it and call [employer] Customer Care at [phone].  Cleaning After use, empty and dry the unit with  a soft cloth. Warm water and mild detergent may be used occasionally to clean the pump and tubes.  WARNING: The Polar Care Cube can be cold enough to cause serious injury, including full skin necrosis. Follow these Operating Instructions, and carefully read the Product Insert (see pouch on side of unit) and the Cold Therapy Pad Fitting Instructions (provided with each Cold Therapy Pad) prior to use.   Interscalene  Nerve Block with Exparel   For your surgery you have received an Interscalene Nerve Block with Exparel. Nerve Blocks affect many types of nerves, including nerves that control movement, pain and normal sensation.  You may experience feelings such as numbness, tingling, heaviness, weakness or the inability to move your arm or the feeling or sensation that your arm has "fallen asleep". A nerve block with Exparel can last up to 5 days.  Usually the weakness wears off first.  The tingling and heaviness usually wear off next.  Finally you may start to notice pain.  Keep in mind that this may occur in any order.  Once a nerve block starts to wear off it is usually completely gone within 60 minutes. ISNB may cause mild shortness of breath, a hoarse voice, blurry vision, unequal pupils, or drooping of the face on the same side as the nerve block.  These symptoms will usually resolve with the numbness.  Very rarely the procedure itself can cause mild seizures. If needed, your surgeon will give you a prescription for pain medication.  It will take about 60 minutes for the oral pain medication to become fully effective.  So, it is recommended that you start taking this medication before the nerve block first begins to wear off, or when you first begin to feel discomfort. Take your pain medication only as prescribed.  Pain medication can cause sedation and decrease your breathing if you take more than you need for the level of pain that you have. Nausea is a common side effect of many pain medications.  You may want to eat something before taking your pain medicine to prevent nausea. After an Interscalene nerve block, you cannot feel pain, pressure or extremes in temperature in the effected arm.  Because your arm is numb it is at an increased risk for injury.  To decrease the possibility of injury, please practice the following:  While you are awake change the position of your arm frequently to prevent too much  pressure on any one area for prolonged periods of time.  If you have a cast or tight dressing, check the color or your fingers every couple of hours.  Call your surgeon with the appearance of any discoloration (white or blue). If you are given a sling to wear before you go home, please wear it  at all times until the block has completely worn off.  Do not get up at night without your sling. Please contact [facility] or your surgeon if you do not begin to regain sensation after 7 days from the surgery.  Anesthesia may be contacted by calling the Same Day Surgery Department, Mon. through Fri., 6 am to 4 pm at [phone].   If you experience any other problems or concerns, please contact your surgeon's office. If you experience severe or prolonged shortness of breath go to the nearest emergency department.    SHOULDER SLING IMMOBILIZER   VIDEO Slingshot 2 Shoulder Brace Application - YouTube ---https://www.porter.info/  INSTRUCTIONS While supporting the injured arm, slide the forearm into the sling. Wrap the  adjustable shoulder strap around the neck and shoulders and attach the strap end to the sling using  the "alligator strap tab."  Adjust the shoulder strap to the required length. Position the shoulder pad behind the neck. To secure the shoulder pad location (optional), pull the shoulder strap away from the shoulder pad, unfold the hook material on the top of the pad, then press the shoulder strap back onto the hook material to secure the pad in place. Attach the closure strap across the open top of the sling. Position the strap so that it holds the arm securely in the sling. Next, attach the thumb strap to the open end of the sling between the thumb and fingers. After sling has been fit, it may be easily removed and reapplied using the quick release buckle on shoulder strap. If a neutral pillow or 15 abduction pillow is included, place the pillow at the waistline.  Attach the sling to the pillow, lining up hook material on the pillow with the loop on sling. Adjust the waist strap to fit.  If waist strap is too long, cut it to fit. Use the small piece of double sided hook material (located on top of the pillow) to secure the strap end. Place the double sided hook material on the inside of the cut strap end and secure it to the waist strap.     If no pillow is included, attach the waist strap to the sling and adjust to fit.    Washing Instructions: Straps and sling must be removed and cleaned regularly depending on your activity level and perspiration. Hand wash straps and sling in cold water with mild detergent, rinse, air dry

## 2023-08-16 NOTE — Anesthesia Procedure Notes (Signed)
 Procedure Name: General with mask airway Date/Time: 08/16/2023 2:55 PM  Performed by: Mohammed Kindle, CRNAPre-anesthesia Checklist: Patient identified, Emergency Drugs available, Suction available and Patient being monitored Patient Re-evaluated:Patient Re-evaluated prior to induction Oxygen Delivery Method: Simple face mask Preoxygenation: Pre-oxygenation with 100% oxygen Induction Type: IV induction Ventilation: Mask ventilation without difficulty Laryngoscope Size: McGrath and 3 Grade View: Grade I Tube type: Oral Number of attempts: 1 Airway Equipment and Method: Stylet Placement Confirmation: ETT inserted through vocal cords under direct vision, positive ETCO2 and breath sounds checked- equal and bilateral Secured at: 21 cm Tube secured with: Tape Dental Injury: Teeth and Oropharynx as per pre-operative assessment

## 2023-08-16 NOTE — Transfer of Care (Signed)
 Immediate Anesthesia Transfer of Care Note  Patient: Jeanmarie Plant., MD  Procedure(s) Performed: LEFT SHOULDER ARTHROSCOPY WITH DEBRIDEMENT, DECOMPRESSION, ROTATOR CUFF REPAIR, AND BICEPS TENODESIS. (Left: Shoulder)  Patient Location: PACU  Anesthesia Type:General  Level of Consciousness: drowsy and patient cooperative  Airway & Oxygen Therapy: Patient Spontanous Breathing and Patient connected to face mask oxygen  Post-op Assessment: Report given to RN and Post -op Vital signs reviewed and stable  Post vital signs: stable, oral airway with face mask 5L oxygen  Last Vitals:  Vitals Value Taken Time  BP 114/54 08/16/23 1645  Temp    Pulse 47 08/16/23 1646  Resp 11 08/16/23 1646  SpO2 99 % 08/16/23 1646  Vitals shown include unfiled device data.  Last Pain:  Vitals:   08/16/23 1435  TempSrc:   PainSc: 0-No pain         Complications: No notable events documented.

## 2023-08-16 NOTE — Anesthesia Procedure Notes (Signed)
 Anesthesia Regional Block: Interscalene brachial plexus block   Pre-Anesthetic Checklist: , timeout performed,  Correct Patient, Correct Site, Correct Laterality,  Correct Procedure, Correct Position, site marked,  Risks and benefits discussed,  Surgical consent,  Pre-op evaluation,  At surgeon's request and post-op pain management  Laterality: Left and Upper  Prep: chloraprep       Needles:  Injection technique: Single-shot  Needle Type: Stimiplex     Needle Length: 5cm  Needle Gauge: 22     Additional Needles:   Procedures:,,,, ultrasound used (permanent image in chart),,    Narrative:  Start time: 08/16/2023 2:27 PM End time: 08/16/2023 2:32 PM Injection made incrementally with aspirations every 5 mL.  Performed by: Personally  Anesthesiologist: Lenard Simmer, MD  Additional Notes: Functioning IV was confirmed and monitors were applied.  A 50mm 22ga Stimuplex needle was used. Sterile prep and drape,hand hygiene and sterile gloves were used.  Negative aspiration and negative test dose prior to incremental administration of local anesthetic. The patient tolerated the procedure well.

## 2023-08-16 NOTE — Anesthesia Postprocedure Evaluation (Signed)
 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 (Left: Shoulder)  Patient location during evaluation: PACU Anesthesia Type: General and Regional Level of consciousness: awake and alert Pain management: pain level controlled Vital Signs Assessment: post-procedure vital signs reviewed and stable Respiratory status: spontaneous breathing, nonlabored ventilation, respiratory function stable and patient connected to nasal cannula oxygen Cardiovascular status: blood pressure returned to baseline and stable Postop Assessment: no apparent nausea or vomiting Anesthetic complications: no   There were no known notable events for this encounter.   Last Vitals:  Vitals:   08/16/23 1730 08/16/23 1737  BP: 130/68 137/68  Pulse: 60 (!) 57  Resp: 12 16  Temp:  (!) 36.3 C  SpO2: 100% 96%    Last Pain:  Vitals:   08/16/23 1737  TempSrc: Temporal  PainSc: 0-No pain                 Louie Boston

## 2023-08-17 ENCOUNTER — Encounter: Payer: Self-pay | Admitting: Surgery

## 2023-09-30 ENCOUNTER — Other Ambulatory Visit: Payer: Self-pay | Admitting: Internal Medicine

## 2023-09-30 DIAGNOSIS — I10 Essential (primary) hypertension: Secondary | ICD-10-CM

## 2023-10-12 ENCOUNTER — Ambulatory Visit
Admission: RE | Admit: 2023-10-12 | Discharge: 2023-10-12 | Disposition: A | Discharge status: Home / Self Care | Payer: Self-pay | Source: Ambulatory Visit | Attending: Internal Medicine | Admitting: Internal Medicine

## 2023-10-12 DIAGNOSIS — I10 Essential (primary) hypertension: Secondary | ICD-10-CM

## 2023-10-25 ENCOUNTER — Other Ambulatory Visit: Payer: Self-pay | Admitting: Urology

## 2023-12-19 ENCOUNTER — Other Ambulatory Visit: Payer: Self-pay

## 2023-12-19 DIAGNOSIS — Z125 Encounter for screening for malignant neoplasm of prostate: Secondary | ICD-10-CM

## 2023-12-23 ENCOUNTER — Other Ambulatory Visit: Payer: Self-pay

## 2023-12-23 DIAGNOSIS — Z125 Encounter for screening for malignant neoplasm of prostate: Secondary | ICD-10-CM

## 2023-12-24 LAB — PSA: Prostate Specific Ag, Serum: 1.7 ng/mL (ref 0.0–4.0)

## 2023-12-28 ENCOUNTER — Ambulatory Visit: Payer: Self-pay | Admitting: Urology

## 2023-12-28 ENCOUNTER — Encounter: Payer: Self-pay | Admitting: Urology

## 2023-12-28 VITALS — BP 127/74 | HR 76 | Ht 69.0 in | Wt 170.0 lb

## 2023-12-28 DIAGNOSIS — N401 Enlarged prostate with lower urinary tract symptoms: Secondary | ICD-10-CM

## 2023-12-28 DIAGNOSIS — Z87898 Personal history of other specified conditions: Secondary | ICD-10-CM

## 2023-12-28 MED ORDER — DUTASTERIDE 0.5 MG PO CAPS: 0.5000 mg | ORAL_CAPSULE | Freq: Every day | ORAL | 3 refills | Status: AC

## 2023-12-28 NOTE — Progress Notes (Signed)
 I, Maysun LITTIE Griffiths, acting as a scribe for Glendia JAYSON Barba, MD., have documented all relevant documentation on the behalf of Glendia JAYSON Barba, MD, as directed by Glendia JAYSON Barba, MD while in the presence of Glendia JAYSON Barba, MD.  12/28/2023 3:48 PM   Raymond Gull Dovie Raddle., MD Jul 17, 1945 [id number]  Referring provider: Lenon Layman ORN, MD [address] [facility] - [place],  [place] [address]  Chief Complaint  Patient presents with   Elevated PSA   Urologic history: 1. BPH with LUTS On combination therapy tamsulosin /dutasteride   2. History of abnormal PSA Prostate biopsy, Dr. Harman 03/2002 for abnormal PSA velocity 1.5-3.5 with benign pathology.  Repeat prostate biopsy Dr Kassie 2010, PSA 5.5 with benign pathology; prostate volume 71 cc.  Uncorrected PSAs last several years have been in the 1-2 range.   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  No significant changes since last year's visit. Recently diagnosed with atrial fibrillation and on diltiazem  and metoprolol .  Remains on tamsulosin  and dutasteride  without complaints.  Uncorrected PSA 12/23/23 stable at 1.7 He was inquiring about stopping either dutasteride  or tamsulosin .  PSA trend   Prostate Specific Ag, Serum  Latest Ref Rng 0.0 - 4.0 ng/mL  12/23/2022 1.1   12/23/2023 1.7      PMH: Past Medical History:  Diagnosis Date   A-fib [facility])    a.) CHA2DS2VASc = 3 (age x 2, HTN) as of 08/12/2023; b.) rate/rhythm maintained on oral diltiazem  + metoprolol  succinate; chronically anticoagulated with apixaban    Adenomatous colon polyp    Amblyopia, right eye    Arthritis    BPH with obstruction/lower urinary tract symptoms    CMV (cytomegalovirus infection) (HCC)    Complication of anesthesia    a.) experienced arrythmia (ventricular bigeminy) during routine during colonoscopy   Diastolic dysfunction 02/16/2022   a.) TTE 02/16/2022: EF >55%, mild LVH, G1DD,  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, norm RVSF, triv MR, mild AR/TR, mod PR, RVSP 26.8 mmHg   History of colon polyps    Hypertension    Intussusception (HCC)    Low vitamin B12 level    Nontraumatic complete tear of left rotator cuff    On apixaban  therapy    Palpitations    Patellofemoral syndrome of right knee    Pneumonia    Pre-diabetes    Primary osteoarthritis of right knee    Rotator cuff tendinitis, left    Sleep apnea    a.) PSG indicated (+) OSAH; PAP therapy recommended --> awaiting DME   Tendinitis of upper biceps tendon of left shoulder    Vertigo     Surgical History: Past Surgical History:  Procedure Laterality Date   COLON SURGERY     COLONOSCOPY  2006, 2008, 2011, 2012   COLONOSCOPY WITH PROPOFOL  N/A 09/05/2015   Procedure: COLONOSCOPY WITH PROPOFOL ;  Surgeon: Lamar ONEIDA Holmes, MD;  Location[facility] ENDOSCOPY;  Service: Endoscopy;  Laterality: N/A;   COLONOSCOPY WITH PROPOFOL  N/A 01/26/2019   Procedure: COLONOSCOPY WITH PROPOFOL ;  Surgeon: Gaylyn Gladis PENNER, MD;  Location: [facility] ENDOSCOPY;  Service: Endoscopy;  Laterality: N/A;   COLONOSCOPY WITH PROPOFOL  N/A 05/19/2023   Procedure: COLONOSCOPY WITH PROPOFOL ;  Surgeon: Onita Elspeth Sharper, DO;  Location: [facility] ENDOSCOPY;  Service: Gastroenterology;  Laterality: N/A;   KNEE ARTHROSCOPY WITH LATERAL MENISECTOMY Right 06/18/2020   Procedure: RIGHT KNEE ARTHROSCOPY WITH DEBRIDEMENT AND REPAIR VERSUS PARTIAL LATERAL MENISCECTOMY.;  Surgeon:  Poggi, Norleen PARAS, MD;  Location: [facility] ORS;  Service: Orthopedics;  Laterality: Right;   POLYPECTOMY  05/19/2023   Procedure: POLYPECTOMY;  Surgeon: Onita Elspeth Sharper, DO;  Location: [facility] ENDOSCOPY;  Service: Gastroenterology;;   SHOULDER ARTHROSCOPY WITH ROTATOR CUFF REPAIR AND OPEN BICEPS TENODESIS Right 06/21/2019   Procedure: SHOULDER ARTHROSCOPY DEBRIDEMENT, DECOMPRESSION, AND REPAIR OF MASSIVE  ROTATOR CUFF TEAR WITH BICEPS TENODESIS.;  Surgeon: Edie Norleen PARAS, MD;  Location: [facility]  ORS;  Service: Orthopedics;  Laterality: Right;   SHOULDER ARTHROSCOPY WITH SUBACROMIAL DECOMPRESSION, ROTATOR CUFF REPAIR AND BICEP TENDON REPAIR Left 08/16/2023   Procedure: LEFT SHOULDER ARTHROSCOPY WITH DEBRIDEMENT, DECOMPRESSION, ROTATOR CUFF REPAIR, AND BICEPS TENODESIS.;  Surgeon: Edie Norleen PARAS, MD;  Location: [facility] ORS;  Service: Orthopedics;  Laterality: Left;   SMALL BOWEL REPAIR  age 64   WISDOM TOOTH EXTRACTION      Home Medications:  Allergies as of 12/28/2023   No Known Allergies      Medication List        Accurate as of December 28, 2023  3:48 PM. If you have any questions, ask your nurse or doctor.          STOP taking these medications    metoprolol  succinate 50 MG 24 hr tablet Commonly known as: TOPROL -XL Stopped by: Glendia JAYSON Barba   oxyCODONE  5 MG immediate release tablet Commonly known as: Roxicodone  Stopped by: Glendia JAYSON Barba       TAKE these medications    aspirin EC 81 MG tablet Take 81 mg by mouth.   atorvastatin 20 MG tablet Commonly known as: LIPITOR Take 20 mg by mouth.   Cartia  XT 240 MG 24 hr capsule Generic drug: diltiazem  Take 240 mg by mouth daily. What changed: Another medication with the same name was removed. Continue taking this medication, and follow the directions you see here. Changed by: Glendia JAYSON Barba   cyanocobalamin 1000 MCG tablet Take 1,000 mcg by mouth daily.   dutasteride  0.5 MG capsule Commonly known as: AVODART  Take 1 capsule (0.5 mg total) by mouth at bedtime.   multivitamin tablet Take 1 tablet by mouth daily. Centrum senior   tamsulosin  0.4 MG Caps capsule Commonly known as: FLOMAX  TAKE 1 CAPSULE BY MOUTH AT BEDTIME.        Allergies: No Known Allergies  Family History: Family History  Problem Relation Age of Onset   Diabetes Mother    Prostate cancer Father    Alzheimer's disease Father    Brain cancer Maternal Aunt        d. 30s   Breast cancer Paternal Aunt    Prostate cancer Paternal  Uncle    Prostate cancer Maternal Grandfather    Lymphoma Cousin        follicular dx 79   Other Cousin        acoustic neuroma    Social History:  reports that he has never smoked. He has never used smokeless tobacco. He reports that he does not currently use alcohol. He reports that he does not use drugs.   Physical Exam: BP 127/74   Pulse 76   Ht 5' 9 (1.753 m)   Wt 170 lb (77.1 kg)   BMI 25.10 kg/m   Constitutional:  Alert and oriented, No acute distress. HEENT: [place] AT Respiratory: Normal respiratory effort, no increased work of breathing. Psychiatric: Normal mood and affect.   Assessment & Plan:    1. BPH with LUTS Presently no bothersome lower urinary tract symptoms.  Discussed discontinuing Tamsulosin  to assess necessity; patient will stop Tamsulosin  and monitor for increased obstructive or irritative voiding symptoms. If symptoms increase, tamsulosin  will be restarted. Continue Dutasteride , which was refilled.  2. History of Elevated PSA Stable PSA at 1.7 Discussed prostate cancer screening guidelines; AUA recommends discontinuing at age 78, with consideration to extend to age 68 for healthier patients. Patient wishes to continue PSA checks for at least another couple of years; did not recommend continuing PSA screening into his eighties if PSA remains stable.  I have reviewed the above documentation for accuracy and completeness, and I agree with the above.   Glendia JAYSON Barba, MD  [facility] [address] [place], [place] [address] [phone]

## 2023-12-28 NOTE — Progress Notes (Signed)
 HPI:  Raymond Vazquez. is a 78 y.o. male who presents for follow-up now 4.5 months status post an extensive arthroscopic debridement, arthroscopic subacromial decompression, mini-open rotator cuff repair, and mini-open biceps tenodesis of his left shoulder.  Overall, the patient feels that he is doing quite well.  He denies any pain in the shoulder on today's visit, and is not taking any medications for discomfort.  He is working on home health exercises at this time.  He has returned back to using left shoulder for routine day-to-day activities while at home.  He denies any falls or trauma affecting left shoulder.  Denies any signs of infection at home such as fevers chills or drainage from the left shoulder incision sites.  Pain score is a 0-10 at today's visit.  He does report noticeable weakness in left arm compared to the right but does feel that this is gradually improving for him.  Current Outpatient Medications  Medication Sig Dispense Refill  . aspirin 81 MG EC tablet Take 81 mg by mouth once daily    . atorvastatin (LIPITOR) 20 MG tablet Take 1 tablet (20 mg total) by mouth once daily 90 tablet 3  . cyanocobalamin (VITAMIN B12) 1000 MCG tablet Take 1,000 mcg by mouth once daily    . dilTIAZem  (CARDIZEM  CD) 240 MG CD capsule Take 1 capsule (240 mg total) by mouth once daily 90 capsule 3  . dutasteride  (AVODART ) 0.5 mg capsule Take 0.5 mg by mouth once daily       . metoprolol  SUCCinate (TOPROL -XL) 100 MG XL tablet Take 1 tablet (100 mg total) by mouth once daily 90 tablet 3  . multivit-min/FA/lycopen/lutein (CENTRUM SILVER MEN ORAL) Take 1 tablet by mouth once daily       . tamsulosin  (FLOMAX ) 0.4 mg capsule Take 0.4 mg by mouth once daily. Take 30 minutes after same meal each day.     No current facility-administered medications for this visit.   No Known Allergies Past Medical History:  Diagnosis Date  . Amblyopia of right eye   . Arrhythmia   . Atrial fibrillation (CMS/HHS-HCC)  03/29/2019   Resolved with IV diltiazem   . Colon polyp 2000  . H/O cytomegalovirus infection   . History of colon polyps   . History of intussusception   . Hx of adenomatous colonic polyps 12/22/2018  . Hypertension   . Hypertrophy (benign) of prostate   . OSA on CPAP 09/28/2023  . Paroxysmal A-fib (CMS/HHS-HCC)   . S/P colonoscopy with polypectomy    Past Surgical History:  Procedure Laterality Date  . SIGMOIDOSCOPY FLEXIBLE  04/27/1998  . COLONOSCOPY  08/28/2010   06/18/2009, 06/29/2006, 04/10/2005; Adenomatous Polyps: [facility] 08/2015; Recall Ltr mailed 07/01/2015 (dw)  . COLONOSCOPY  09/05/2015   Adenomatous Polyps: [facility] 08/2018 Recall ltr mailed   . COLONOSCOPY  01/26/2019   Tubular adenoma of the colon/Repeat 3 to 5 yrs/MUS  (07/09/2022 Recall letter returned.[email or web address])  . Limited arthroscopic debridement, arthroscopic subacromial decompression, mini-open rotator cuff repair using a dermal allograft, and mini-open biceps tenodesis, right shoulder. Right 06/21/2019   Dr. Edie  . Arthroscopic debridement with partial lateral meniscectomy, right knee. Right 06/18/2020   Dr. Edie  . Colon @ [facility]  05/19/2023   Tubular adenomas/PHx CP/Repeat 37yrs/SMR  . ARTHROSCOPIC ROTATOR CUFF REPAIR Left 08/16/2023  . COLON SURGERY  1960   respected 20 inches of small intestine due to intusseception  . COLONOSCOPY  2017  . KNEE ARTHROSCOPY  06/2020   partial meniscus removal  right knee  . POLYPECTOMY  2000  . RESECTION SMALL BOWEL    . SMALL INTESTINE SURGERY  89o   Intussusception    Family History  Problem Relation Name Age of Onset  . Diabetes Mother Donaldine   . Diabetes type II Mother Donaldine        DECEASED  . Glaucoma Mother Donaldine   . Hip fracture Mother Donaldine   . Osteoporosis (Thinning of bones) Mother Donaldine   . Prostate cancer Father Okey   . Alzheimer's disease Father Okey   . Cancer Father Okey        prostate cancer  . Prostate cancer Paternal Apolinar Kotyk   .  Cancer Paternal Apolinar Kotyk        prostate cancer  . Prostate cancer Paternal Higinio Patient   . Cancer Paternal Higinio Patient        prostate cancer  . Diabetes Maternal Grandmother Ella   . Peripheral Vascular Disease (PVD or blocked arteries in arms and legs) Maternal Grandmother Toy        Had diabetes  . Heart failure Paternal Grandmother    . Coronary Artery Disease (Blocked arteries around heart) Maternal Grandfather Theodore   . Myocardial Infarction (Heart attack) Maternal Grandfather Theodore   . Prostate cancer Paternal Uncle    . High blood pressure (Hypertension) Maternal Aunt Rachel     Social History   Socioeconomic History  . Marital status: Married    Spouse name: Patsy  . Number of children: 1  Occupational History  . Occupation: physician  Tobacco Use  . Smoking status: Never  . Smokeless tobacco: Never  Vaping Use  . Vaping status: Never Used  Substance and Sexual Activity  . Alcohol use: Not Currently  . Drug use: No  . Sexual activity: Not Currently    Partners: Female    Birth control/protection: None   Social Drivers of Health   Financial Resource Strain: Low Risk  (12/28/2023)   Overall Financial Resource Strain (CARDIA)   . Difficulty of Paying Living Expenses: Not hard at all  Food Insecurity: No Food Insecurity (12/28/2023)   Hunger Vital Sign   . Worried About Programme researcher, broadcasting/film/video in the Last Year: Never true   . Ran Out of Food in the Last Year: Never true  Transportation Needs: No Transportation Needs (12/28/2023)   PRAPARE - Transportation   . Lack of Transportation (Medical): No   . Lack of Transportation (Non-Medical): No    Review of Systems:  A comprehensive 14 point ROS was performed, reviewed, and the pertinent orthopaedic findings are documented in the HPI.  Exam: BP 136/70   Ht 175.3 cm (5' 9)   Wt 76.9 kg (169 lb 9.6 oz)   BMI 25.05 kg/m  General/Constitutional: The patient appears to be well-nourished,  well-developed, and in no acute distress. Neuro/Psych: Normal mood and affect, oriented to person, place and time. Eyes: Non-icteric.  Pupils are equal, round, and reactive to light, and exhibit synchronous movement. ENT: Unremarkable. Lymphatic: No palpable adenopathy. Respiratory: Non-labored breathing Cardiovascular: No edema, swelling or tenderness, except as noted in detailed exam. Integumentary: No impressive skin lesions present, except as noted in detailed exam. Musculoskeletal: Unremarkable, except as noted in detailed exam.  Left shoulder exam: Skin inspection of the left shoulder demonstrates his surgical incision and arthroscopic portal sites to be well-healed without evidence for infection.  No swelling, erythema, ecchymosis, abrasions, or other skin abnormalities are identified.  He does have a small  Popeye deformity in the upper arm which is nontender to palpation.  There are no other areas of tenderness around the anterior lateral aspects of the shoulder.  Actively he has full forward flexion and abduction.  With the left arm abducted 9 degrees can tolerate external rotation close 90 degrees, into rotation 70 degrees.  He is able to internally rotate behind his back to the L1 vertebral body.  The patient has excellent strength with resisted internal extra rotation with arm at his side in addition to forward flexion and abduction.  Assessment: Encounter Diagnoses  Name Primary?  . Tendinitis of upper biceps tendon of left shoulder Yes  . Rotator cuff tendinitis, left   . Nontraumatic complete tear of left rotator cuff   . 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 Status post arthroscopy of left shoulder      Plan: 1.  Treatment options were discussed today with the patient. 2.  The patient will continue to perform routine activities at home.  Continue with home exercises at this time. 3.  Continue working on range of motion without restrictions.  May continue  to increase his weightbearing as tolerated to the left upper extremity. 4.  He will follow-up in 6 weeks for repeat evaluation left shoulder with either myself or Dr. Edie. 5.  He can contact the clinic if he has any questions, new symptoms develop or symptoms worsen.  DOROTHA Gustavo Level, PA-C, CAQ-OS [facility]

## 2024-04-04 ENCOUNTER — Telehealth: Payer: Self-pay | Admitting: Family Medicine
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
## 2024-12-27 ENCOUNTER — Other Ambulatory Visit

## 2024-12-28 ENCOUNTER — Ambulatory Visit: Admitting: Urology
